# Patient Record
Sex: Male | Born: 1980 | Race: Black or African American | Hispanic: No | Marital: Married | Smoking: Current every day smoker
Health system: Southern US, Community
[De-identification: ages and names within clinical notes are randomized; demographics above are authoritative.]

---

## 2015-03-18 ENCOUNTER — Other Ambulatory Visit: Payer: Self-pay | Admitting: Infectious Disease

## 2015-03-18 ENCOUNTER — Ambulatory Visit
Admission: RE | Admit: 2015-03-18 | Discharge: 2015-03-18 | Disposition: A | Discharge status: Home / Self Care | Payer: No Typology Code available for payment source | Source: Ambulatory Visit | Attending: Infectious Disease | Admitting: Infectious Disease

## 2015-03-18 DIAGNOSIS — Z139 Encounter for screening, unspecified: Secondary | ICD-10-CM

## 2019-03-29 ENCOUNTER — Other Ambulatory Visit: Payer: Self-pay

## 2019-03-29 ENCOUNTER — Emergency Department (HOSPITAL_COMMUNITY)
Admission: EM | Admit: 2019-03-29 | Discharge: 2019-03-29 | Disposition: A | Discharge status: Home / Self Care | Payer: No Typology Code available for payment source | Attending: Emergency Medicine | Admitting: Emergency Medicine

## 2019-03-29 ENCOUNTER — Encounter (HOSPITAL_COMMUNITY): Payer: Self-pay | Admitting: Emergency Medicine

## 2019-03-29 ENCOUNTER — Emergency Department (HOSPITAL_COMMUNITY): Payer: No Typology Code available for payment source

## 2019-03-29 DIAGNOSIS — M79601 Pain in right arm: Secondary | ICD-10-CM | POA: Diagnosis not present

## 2019-03-29 DIAGNOSIS — F17210 Nicotine dependence, cigarettes, uncomplicated: Secondary | ICD-10-CM | POA: Insufficient documentation

## 2019-03-29 DIAGNOSIS — M25521 Pain in right elbow: Secondary | ICD-10-CM | POA: Diagnosis present

## 2019-03-29 DIAGNOSIS — Y9389 Activity, other specified: Secondary | ICD-10-CM | POA: Diagnosis not present

## 2019-03-29 DIAGNOSIS — Y990 Civilian activity done for income or pay: Secondary | ICD-10-CM | POA: Diagnosis not present

## 2019-03-29 DIAGNOSIS — Y9289 Other specified places as the place of occurrence of the external cause: Secondary | ICD-10-CM | POA: Insufficient documentation

## 2019-03-29 DIAGNOSIS — X500XXA Overexertion from strenuous movement or load, initial encounter: Secondary | ICD-10-CM | POA: Insufficient documentation

## 2019-03-29 MED ORDER — NAPROXEN 500 MG PO TABS: 500.0000 mg | ORAL_TABLET | Freq: Two times a day (BID) | ORAL | 0 refills | Status: DC

## 2019-03-29 NOTE — ED Provider Notes (Signed)
[facility]-EMERGENCY DEPT Provider Note   CSN: [id number] Arrival date & time: 03/29/19  [id number]     History   Chief Complaint Chief Complaint  Patient presents with  . Arm Pain    HPI Kristopher Avila is a 38 y.o. male.     Patient states that he hurt his right elbow while at work.  He was pushing a heavy object and started having pain in the right anterior elbow  The history is provided by the patient. No language interpreter was used.  Arm Pain This is a new problem. The current episode started 12 to 24 hours ago. The problem occurs constantly. The problem has not changed since onset.Pertinent negatives include no chest pain, no abdominal pain and no headaches. Nothing aggravates the symptoms. He has tried nothing for the symptoms.    History reviewed. No pertinent past medical history.  There are no active problems to display for this patient.   History reviewed. No pertinent surgical history.      Home Medications    Prior to Admission medications   Medication Sig Start Date End Date Taking? Authorizing Provider  naproxen (NAPROSYN) 500 MG tablet Take 1 tablet (500 mg total) by mouth 2 (two) times daily. 03/29/19   Bethann BerkshireZammit, Christin Mccreedy, MD    Family History No family history on file.  Social History Social History   Tobacco Use  . Smoking status: Current Every Day Smoker    Types: Cigarettes  . Smokeless tobacco: Never Used  Substance Use Topics  . Alcohol use: Not Currently  . Drug use: Not on file     Allergies   Patient has no known allergies.   Review of Systems Review of Systems  Constitutional: Negative for appetite change and fatigue.  HENT: Negative for congestion, ear discharge and sinus pressure.   Eyes: Negative for discharge.  Respiratory: Negative for cough.   Cardiovascular: Negative for chest pain.  Gastrointestinal: Negative for abdominal pain and diarrhea.  Genitourinary: Negative for frequency and hematuria.   Musculoskeletal: Negative for back pain.       Right elbow pain  Skin: Negative for rash.  Neurological: Negative for seizures and headaches.  Psychiatric/Behavioral: Negative for hallucinations.     Physical Exam Updated Vital Signs BP (!) 153/91 (BP Location: Left Arm)   Pulse 66   Temp 97.9 F (36.6 C) (Oral)   Resp 18   Ht 5\' 5"  (1.651 m)   Wt 83.9 kg   SpO2 100%   BMI 30.79 kg/m   Physical Exam Constitutional:      Appearance: He is well-developed.  HENT:     Head: Normocephalic.     Nose: Nose normal.  Eyes:     General: No scleral icterus.    Conjunctiva/sclera: Conjunctivae normal.  Neck:     Musculoskeletal: Neck supple.     Thyroid: No thyromegaly.     Trachea: No tracheal deviation.  Cardiovascular:     Rate and Rhythm: Normal rate and regular rhythm.     Heart sounds: No murmur. No friction rub. No gallop.   Pulmonary:     Breath sounds: No stridor. No wheezing or rales.  Chest:     Chest wall: No tenderness.  Abdominal:     General: There is no distension.     Tenderness: There is no abdominal tenderness. There is no rebound.  Musculoskeletal: Normal range of motion.     Comments: Tenderness to biceps tender  Lymphadenopathy:     Cervical:  No cervical adenopathy.  Skin:    General: Skin is warm.     Findings: No erythema or rash.  Neurological:     Mental Status: He is alert and oriented to person, place, and time.     Motor: No abnormal muscle tone.     Coordination: Coordination normal.  Psychiatric:        Behavior: Behavior normal.      ED Treatments / Results  Labs (all labs ordered are listed, but only abnormal results are displayed) Labs Reviewed - No data to display  EKG None  Radiology Dg Elbow Complete Right  Result Date: 03/29/2019 CLINICAL DATA:  Right elbow pain after injury at work today. EXAM: RIGHT ELBOW - COMPLETE 3+ VIEW COMPARISON:  None. FINDINGS: There is no evidence of fracture, dislocation, or joint effusion.  There is no evidence of arthropathy or other focal bone abnormality. Soft tissues are unremarkable. IMPRESSION: Negative. Electronically Signed   By: Marijo Conception M.D.   On: 03/29/2019 09:08    Procedures Procedures (including critical care time)  Medications Ordered in ED Medications - No data to display   Initial Impression / Assessment and Plan / ED Course  I have reviewed the triage vital signs and the nursing notes.  Pertinent labs & imaging results that were available during my care of the patient were reviewed by me and considered in my medical decision making (see chart for details).        Biceps tendonitis.  tx with naprosyn  Final Clinical Impressions(s) / ED Diagnoses   Final diagnoses:  Right arm pain    ED Discharge Orders         Ordered    naproxen (NAPROSYN) 500 MG tablet  2 times daily     03/29/19 0934           Milton Ferguson, MD 03/29/19 [id number]

## 2019-03-29 NOTE — ED Triage Notes (Signed)
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 Pain from shoulder to hand.

## 2019-03-29 NOTE — Discharge Instructions (Signed)
Follow up with Dr. Dean.

## 2019-08-04 ENCOUNTER — Other Ambulatory Visit: Payer: Self-pay

## 2019-08-04 ENCOUNTER — Encounter: Payer: Self-pay | Admitting: Family Medicine

## 2019-08-04 ENCOUNTER — Ambulatory Visit (INDEPENDENT_AMBULATORY_CARE_PROVIDER_SITE_OTHER): Payer: BC Managed Care – PPO | Admitting: Family Medicine

## 2019-08-04 ENCOUNTER — Other Ambulatory Visit (HOSPITAL_COMMUNITY)
Admission: RE | Admit: 2019-08-04 | Discharge: 2019-08-04 | Disposition: A | Discharge status: Home / Self Care | Payer: BC Managed Care – PPO | Source: Ambulatory Visit | Attending: Family Medicine | Admitting: Family Medicine

## 2019-08-04 VITALS — BP 140/78 | HR 84 | Temp 98.7°F | Wt 194.6 lb

## 2019-08-04 DIAGNOSIS — N341 Nonspecific urethritis: Secondary | ICD-10-CM | POA: Insufficient documentation

## 2019-08-04 DIAGNOSIS — R300 Dysuria: Secondary | ICD-10-CM

## 2019-08-04 MED ORDER — METRONIDAZOLE 500 MG PO TABS: 2000.0000 mg | ORAL_TABLET | Freq: Once | ORAL | 0 refills | Status: AC

## 2019-08-04 NOTE — Patient Instructions (Addendum)
I will check test for causes of urethritis, but think it is reasonable to take metronidazole for possible trichomonas infection.  4 pills all at once.  We will let you know when we have the results of the other test.  It is reasonable to repeat STI testing in another 4 weeks.  If symptoms are not improving in the next 1 to 2 weeks, let me know.   Return to the clinic or go to the nearest emergency room if any of your symptoms worsen or new symptoms occur.   Urethritis, Adult  Urethritis is swelling (inflammation) of the urethra. The urethra is the tube that drains urine from the bladder. It is important to get treatment for this condition early. Delayed treatment may lead to complications. What are the causes? This condition may be caused by:  Germs that are spread through sexual contact. This is the leading cause of urethritis. This may include bacterial or viral infections.  Injury to the urethra. Injury can happen after a thin, flexible tube (catheter) is inserted into the urethra to drain urine, or after medical instruments or foreign bodies are inserted into the area.  Chemical irritation. This may include contact with spermicide.  A disease that causes inflammation. This is rare. What increases the risk? The following factors may make you more likely to develop this condition:  Having sex without using a condom.  Having multiple sexual partners.  Having poor hygiene. What are the signs or symptoms? Symptoms of this condition include:  Pain with urination.  Frequent urination.  Urgent need to urinate.  Itching and pain in the vagina or penis.  Discharge coming from the penis. However, women rarely have symptoms. How is this diagnosed? This condition is diagnosed based on your medical history and symptoms as well as a physical exam. Tests may also be done. These may include:  Urine tests.  Swabs from the urethra. How is this treated? Treatment for this condition  depends on the cause. Urethritis caused by a bacterial infection is treated with antibiotic medicine. Any sexual partners must also be treated. Follow these instructions at home: Medicines  Take over-the-counter and prescription medicines only as told by your health care provider.  If you were prescribed antibiotic medicine, take it as told by your health care provider. Do not stop taking the antibiotic even if you start to feel better. Lifestyle  Avoid using perfumed soaps, bubble bath, and shampoo when you bathe or shower. Rinse the vaginal area after bathing.  Wear cotton underwear. Not wearing underwear when going to bed can help.  Make sure to wipe from front to back after using the toilet if you are male.  Do not have sex until your health care provider approves. When you do have sex, be sure to practice safe sex.  Tell anyone with whom you have had sexual relations in the past 60 days that he or she may be at risk of infection. General instructions  Drink enough fluid to keep your urine clear or pale yellow.  It is up to you to get your test results. Ask your health care provider, or the department that is doing the test, when your results will be ready.  Keep all follow-up visits as told by your health care provider. This is important.  Get tested again 3 months after treatment to make sure the infection is gone. It is important that your sexual partner also gets tested again. Contact a health care provider if:  Your symptoms have  not improved after 3 days.  Your symptoms get worse.  You have eye redness or pain.  You develop abdominal pain or pelvic pain (in females).  You develop joint pain.  You have a fever. Get help right away if:  You have severe pain in the belly, back, or side.  You vomit repeatedly. Summary  Urethritis is a swelling (inflammation) of the urethra.  This condition is caused by germs that are spread through sexual contact. This is the  main cause of this illness.  It is important to get treatment for this condition early. Delayed treatment may lead to complications.  Treatment for this condition depends on the cause. Any sexual partners must also be treated. This information is not intended to replace advice given to you by your health care provider. Make sure you discuss any questions you have with your health care provider. Document Released: 02/21/2001 Document Revised: 08/10/2017 Document Reviewed: 10/03/2016 Elsevier Patient Education  [employer].    If you have lab work done today you will be contacted with your lab results within the next 2 weeks.  If you have not heard from [place] then please contact us. The fastest way to get your results is to register for My Chart.   IF you received an x-ray today, you will receive an invoice from [facility]. Please contact [facility] at [phone] with questions or concerns regarding your invoice.   IF you received labwork today, you will receive an invoice from [facility]. Please contact [facility] at [phone] with questions or concerns regarding your invoice.   Our billing staff will not be able to assist you with questions regarding bills from these companies.  You will be contacted with the lab results as soon as they are available. The fastest way to get your results is to activate your My Chart account. Instructions are located on the last page of this paperwork. If you have not heard from [place] regarding the results in 2 weeks, please contact this office.

## 2019-08-04 NOTE — Progress Notes (Signed)
Subjective:  Patient ID: Kristopher Avila, male    DOB: Jul 17, 1981  Age: 38 y.o. MRN: [id number]  CC:  Chief Complaint  Patient presents with   Penis Pain    pain pain after unprotected sex but was seen at Urgent care on 07/17/19 and checked for std but they came back negative but still having penial pain.    HPI Kristopher Avila presents for   Penile pain: Started inside the penis on 10/26. Sometimes testicular, but primarily penis.  Unprotected intercourse day prior with new partner. 11 days later - had testing at urgent care 11/5- had reported negative/normal  HIV, syphilis, chlamydia and gonorrhea testing.  No penile rash/blister, no discharge.  Some dysuria - particularly at end of urination. No prior similar sx's. No attempted treatments.   No change in diet/caffeine/spicy food.       History There are no active problems to display for this patient.  No past medical history on file. No past surgical history on file. No Known Allergies Prior to Admission medications   Not on File   Social History   Socioeconomic History   Marital status: Married    Spouse name: Not on file   Number of children: Not on file   Years of education: Not on file   Highest education level: Not on file  Occupational History   Not on file  Social Needs   Financial resource strain: Not on file   Food insecurity    Worry: Not on file    Inability: Not on file   Transportation needs    Medical: Not on file    Non-medical: Not on file  Tobacco Use   Smoking status: Current Every Day Smoker    Packs/day: 0.50    Types: Cigarettes   Smokeless tobacco: Current User  Substance and Sexual Activity   Alcohol use: Not Currently   Drug use: Not Currently   Sexual activity: Yes  Lifestyle   Physical activity    Days per week: Not on file    Minutes per session: Not on file   Stress: Not on file  Relationships   Social connections    Talks on phone: Not on file    Gets together:  Not on file    Attends religious service: Not on file    Active member of club or organization: Not on file    Attends meetings of clubs or organizations: Not on file    Relationship status: Not on file   Intimate partner violence    Fear of current or ex partner: Not on file    Emotionally abused: Not on file    Physically abused: Not on file    Forced sexual activity: Not on file  Other Topics Concern   Not on file  Social History Narrative   Not on file    Review of Systems   Objective:   Vitals:   08/04/19 1400 08/04/19 1407  BP: 140/78 140/78  Pulse: 84   Temp: 98.7 F (37.1 C)   TempSrc: Oral   SpO2: 97%   Weight: 194 lb 9.6 oz (88.3 kg)      Physical Exam Constitutional:      General: He is not in acute distress.    Appearance: He is well-developed.  HENT:     Head: Normocephalic and atraumatic.  Cardiovascular:     Rate and Rhythm: Normal rate.  Pulmonary:     Effort: Pulmonary effort is normal.  Genitourinary:  Pubic Area: No rash.      Penis: Normal. No discharge, swelling or lesions.      Scrotum/Testes: Normal.        Right: Tenderness not present.        Left: Tenderness not present.     Epididymis:     Right: No tenderness.     Left: No tenderness.  Lymphadenopathy:     Lower Body: No right inguinal adenopathy. No left inguinal adenopathy.  Neurological:     Mental Status: He is alert and oriented to person, place, and time.        Assessment & Plan:  Kristopher Avila is a 38 y.o. male . NGU (nongonococcal urethritis) - Plan: Urine cytology ancillary only, metroNIDAZOLE (FLAGYL) 500 MG tablet  Dysuria  Suspected nongonococcal urethritis with reported previous negative HIV, RPR, chlamydia and gonorrhea testing.  With persistent symptoms will repeat testing for gonorrhea and trichomonas but treat for possible trichomonas with metronidazole 2 g x 1.  Potential side effects discussed.  RTC precautions given if persistent and to consider repeat  STI testing in 1 month.    No orders of the defined types were placed in this encounter.  Patient Instructions   I will check test for causes of urethritis, but think it is reasonable to take metronidazole for possible trichomonas infection.  4 pills all at once.  We will let you know when we have the results of the other test.  It is reasonable to repeat STI testing in another 4 weeks.  If symptoms are not improving in the next 1 to 2 weeks, let me know.   Return to the clinic or go to the nearest emergency room if any of your symptoms worsen or new symptoms occur.   Urethritis, Adult  Urethritis is swelling (inflammation) of the urethra. The urethra is the tube that drains urine from the bladder. It is important to get treatment for this condition early. Delayed treatment may lead to complications. What are the causes? This condition may be caused by:  Germs that are spread through sexual contact. This is the leading cause of urethritis. This may include bacterial or viral infections.  Injury to the urethra. Injury can happen after a thin, flexible tube (catheter) is inserted into the urethra to drain urine, or after medical instruments or foreign bodies are inserted into the area.  Chemical irritation. This may include contact with spermicide.  A disease that causes inflammation. This is rare. What increases the risk? The following factors may make you more likely to develop this condition:  Having sex without using a condom.  Having multiple sexual partners.  Having poor hygiene. What are the signs or symptoms? Symptoms of this condition include:  Pain with urination.  Frequent urination.  Urgent need to urinate.  Itching and pain in the vagina or penis.  Discharge coming from the penis. However, women rarely have symptoms. How is this diagnosed? This condition is diagnosed based on your medical history and symptoms as well as a physical exam. Tests may also be  done. These may include:  Urine tests.  Swabs from the urethra. How is this treated? Treatment for this condition depends on the cause. Urethritis caused by a bacterial infection is treated with antibiotic medicine. Any sexual partners must also be treated. Follow these instructions at home: Medicines  Take over-the-counter and prescription medicines only as told by your health care provider.  If you were prescribed antibiotic medicine, take it as told by your  health care provider. Do not stop taking the antibiotic even if you start to feel better. Lifestyle  Avoid using perfumed soaps, bubble bath, and shampoo when you bathe or shower. Rinse the vaginal area after bathing.  Wear cotton underwear. Not wearing underwear when going to bed can help.  Make sure to wipe from front to back after using the toilet if you are male.  Do not have sex until your health care provider approves. When you do have sex, be sure to practice safe sex.  Tell anyone with whom you have had sexual relations in the past 60 days that he or she may be at risk of infection. General instructions  Drink enough fluid to keep your urine clear or pale yellow.  It is up to you to get your test results. Ask your health care provider, or the department that is doing the test, when your results will be ready.  Keep all follow-up visits as told by your health care provider. This is important.  Get tested again 3 months after treatment to make sure the infection is gone. It is important that your sexual partner also gets tested again. Contact a health care provider if:  Your symptoms have not improved after 3 days.  Your symptoms get worse.  You have eye redness or pain.  You develop abdominal pain or pelvic pain (in females).  You develop joint pain.  You have a fever. Get help right away if:  You have severe pain in the belly, back, or side.  You vomit repeatedly. Summary  Urethritis is a swelling  (inflammation) of the urethra.  This condition is caused by germs that are spread through sexual contact. This is the main cause of this illness.  It is important to get treatment for this condition early. Delayed treatment may lead to complications.  Treatment for this condition depends on the cause. Any sexual partners must also be treated. This information is not intended to replace advice given to you by your health care provider. Make sure you discuss any questions you have with your health care provider. Document Released: 02/21/2001 Document Revised: 08/10/2017 Document Reviewed: 10/03/2016 Elsevier Patient Education  [employer]2020 [employer] Inc[employer]    If you have lab work done today you will be contacted with your lab results within the next 2 weeks.  If you have not heard from us then please contact us. The fastest way to get your results is to register for My Chart.   IF you received an x-ray today, you will receive an invoice from [facility]. Please contact [facility] at [phone] with questions or concerns regarding your invoice.   IF you received labwork today, you will receive an invoice from [facility]. Please contact [facility] at [phone] with questions or concerns regarding your invoice.   Our billing staff will not be able to assist you with questions regarding bills from these companies.  You will be contacted with the lab results as soon as they are available. The fastest way to get your results is to activate your My Chart account. Instructions are located on the last page of this paperwork. If you have not heard from us regarding the results in 2 weeks, please contact this office.          Signed, Meredith StaggersJeffrey Lakayla Barrington, MD [facility]

## 2019-08-05 LAB — URINE CYTOLOGY ANCILLARY ONLY
Chlamydia: POSITIVE — AB
Comment: NEGATIVE
Comment: NEGATIVE
Comment: NORMAL
Neisseria Gonorrhea: NEGATIVE
Trichomonas: NEGATIVE

## 2019-08-08 ENCOUNTER — Other Ambulatory Visit: Payer: Self-pay | Admitting: Family Medicine

## 2019-08-08 DIAGNOSIS — A749 Chlamydial infection, unspecified: Secondary | ICD-10-CM

## 2019-08-08 MED ORDER — AZITHROMYCIN 250 MG PO TABS: 1000.0000 mg | ORAL_TABLET | Freq: Once | ORAL | 0 refills | Status: AC

## 2019-08-11 ENCOUNTER — Telehealth: Payer: Self-pay | Admitting: Family Medicine

## 2019-08-11 NOTE — Telephone Encounter (Signed)
Pt needs a call back  To see when he can get retested again   Please advise

## 2019-08-11 NOTE — Telephone Encounter (Signed)
Spoke with pt and he wants to know if he needs to return for testing.  Advised he doesn't need to be retested because he was given azithromycin 1000 mg  x 1 and this will clear it up.  Pt interested in having unprotected sex with partner.  I advised to use protection for the next week.  Pt agreeable.

## 2019-08-25 ENCOUNTER — Ambulatory Visit: Payer: BC Managed Care – PPO | Admitting: Family Medicine

## 2019-08-28 ENCOUNTER — Other Ambulatory Visit: Payer: Self-pay

## 2019-08-28 ENCOUNTER — Other Ambulatory Visit (HOSPITAL_COMMUNITY)
Admission: RE | Admit: 2019-08-28 | Discharge: 2019-08-28 | Disposition: A | Discharge status: Home / Self Care | Payer: BC Managed Care – PPO | Source: Ambulatory Visit | Attending: Family Medicine | Admitting: Family Medicine

## 2019-08-28 ENCOUNTER — Ambulatory Visit (INDEPENDENT_AMBULATORY_CARE_PROVIDER_SITE_OTHER): Payer: BC Managed Care – PPO | Admitting: Family Medicine

## 2019-08-28 ENCOUNTER — Encounter: Payer: Self-pay | Admitting: Family Medicine

## 2019-08-28 VITALS — BP 133/82 | HR 72 | Temp 98.4°F | Wt 194.8 lb

## 2019-08-28 DIAGNOSIS — A749 Chlamydial infection, unspecified: Secondary | ICD-10-CM | POA: Diagnosis not present

## 2019-08-28 DIAGNOSIS — Z7251 High risk heterosexual behavior: Secondary | ICD-10-CM | POA: Diagnosis not present

## 2019-08-28 NOTE — Progress Notes (Signed)
Subjective:  Patient ID: Kristopher Avila, male    DOB: [birth year]  Age: 38 y.o. MRN: [id number]  CC:  Chief Complaint  Patient presents with  . urethritis    Here 3 f/u on urethritis. No new issues ans no other issues to discuss at this time    HPI Kristopher Avila presents for   Penile pain: Discussed November 23.  Symptoms started October 26, unprotected intercourse the day prior.  Primarily penile discomfort, occasionally testicular.  Had reported negative STI testing at outside urgent care November 5.  Still some persistent dysuria when he saw me November 23.  His prior normal testing reported, suspicious for nongonococcal urethritis, treated with metronidazole for possible trichomonas.  Unfortunately his testing did come back positive for chlamydia.  Prescription for 1000 mg azithromycin x1 given on November 27. Took prescription. Occasional discomfort for about a week later.   Feels ok now. No further dysuria, no penile pain. No rash.   No unprotected intercourse since 10/25.   History There are no problems to display for this patient.  No past medical history on file. No past surgical history on file. No Known Allergies Prior to Admission medications   Not on File   Social History   Socioeconomic History  . Marital status: Married    Spouse name: Not on file  . Number of children: Not on file  . Years of education: Not on file  . Highest education level: Not on file  Occupational History  . Not on file  Tobacco Use  . Smoking status: Current Every Day Smoker    Packs/day: 0.50    Types: Cigarettes  . Smokeless tobacco: Current User  Substance and Sexual Activity  . Alcohol use: Not Currently  . Drug use: Not Currently  . Sexual activity: Yes  Other Topics Concern  . Not on file  Social History Narrative  . Not on file   Social Determinants of Health   Financial Resource Strain:   . Difficulty of Paying Living Expenses: Not on file  Food Insecurity:   . Worried  About Charity fundraiser in the Last Year: Not on file  . Ran Out of Food in the Last Year: Not on file  Transportation Needs:   . Lack of Transportation (Medical): Not on file  . Lack of Transportation (Non-Medical): Not on file  Physical Activity:   . Days of Exercise per Week: Not on file  . Minutes of Exercise per Session: Not on file  Stress:   . Feeling of Stress : Not on file  Social Connections:   . Frequency of Communication with Friends and Family: Not on file  . Frequency of Social Gatherings with Friends and Family: Not on file  . Attends Religious Services: Not on file  . Active Member of Clubs or Organizations: Not on file  . Attends Archivist Meetings: Not on file  . Marital Status: Not on file  Intimate Partner Violence:   . Fear of Current or Ex-Partner: Not on file  . Emotionally Abused: Not on file  . Physically Abused: Not on file  . Sexually Abused: Not on file    Review of Systems  Genitourinary: Negative for difficulty urinating, discharge, dysuria, genital sores, hematuria, penile pain, scrotal swelling and testicular pain.     Objective:   Vitals:   08/28/19 1431  BP: 133/82  Pulse: 72  Temp: 98.4 F (36.9 C)  TempSrc: Temporal  SpO2: 100%  Weight: 194 lb  12.8 oz (88.4 kg)     Physical Exam Constitutional:      General: He is not in acute distress.    Appearance: He is well-developed.  HENT:     Head: Normocephalic and atraumatic.  Cardiovascular:     Rate and Rhythm: Normal rate.  Pulmonary:     Effort: Pulmonary effort is normal.  Abdominal:     General: Abdomen is flat.     Tenderness: There is no abdominal tenderness. There is no right CVA tenderness or left CVA tenderness.  Neurological:     Mental Status: He is alert and oriented to person, place, and time.        Assessment & Plan:  Kristopher Avila is a 38 y.o. male . Chlamydia - Plan: HIV antibody, RPR, Urine cytology ancillary only  Unprotected sexual  intercourse - Plan: HIV antibody, RPR, Urine cytology ancillary only Chlamydia infection, with resolution of symptoms.  Status post azithromycin as above.  Requested repeat testing, although discussed test of cure not typically necessary.  Safer sex practices discussed.  RTC precautions given.  No orders of the defined types were placed in this encounter.  Patient Instructions    Although we can recheck testing today, based on the resolution of your symptoms with medication, you should not be contagious at this point.  I do recommend condoms to minimize exposure to sexually transmitted infections.  Please let me know if there are questions and take care.    If you have lab work done today you will be contacted with your lab results within the next 2 weeks.  If you have not heard from [place] then please contact us. The fastest way to get your results is to register for My Chart.   IF you received an x-ray today, you will receive an invoice from [facility]. Please contact [facility] at [phone] with questions or concerns regarding your invoice.   IF you received labwork today, you will receive an invoice from [place]. Please contact [facility] at [phone] with questions or concerns regarding your invoice.   Our billing staff will not be able to assist you with questions regarding bills from these companies.  You will be contacted with the lab results as soon as they are available. The fastest way to get your results is to activate your My Chart account. Instructions are located on the last page of this paperwork. If you have not heard from [place] regarding the results in 2 weeks, please contact this office.          Signed, Meredith Staggers, MD [facility]

## 2019-08-28 NOTE — Patient Instructions (Addendum)
  Although we can recheck testing today, based on the resolution of your symptoms with medication, you should not be contagious at this point.  I do recommend condoms to minimize exposure to sexually transmitted infections.  Please let me know if there are questions and take care.    If you have lab work done today you will be contacted with your lab results within the next 2 weeks.  If you have not heard from [place] then please contact us. The fastest way to get your results is to register for My Chart.   IF you received an x-ray today, you will receive an invoice from [facility]. Please contact [facility] at [phone] with questions or concerns regarding your invoice.   IF you received labwork today, you will receive an invoice from [place]. Please contact [facility] at [phone] with questions or concerns regarding your invoice.   Our billing staff will not be able to assist you with questions regarding bills from these companies.  You will be contacted with the lab results as soon as they are available. The fastest way to get your results is to activate your My Chart account. Instructions are located on the last page of this paperwork. If you have not heard from [place] regarding the results in 2 weeks, please contact this office.

## 2019-08-29 LAB — URINE CYTOLOGY ANCILLARY ONLY
Chlamydia: NEGATIVE
Comment: NEGATIVE
Comment: NORMAL
Neisseria Gonorrhea: NEGATIVE

## 2019-08-29 LAB — HIV ANTIBODY (ROUTINE TESTING W REFLEX): HIV Screen 4th Generation wRfx: NONREACTIVE

## 2019-08-29 LAB — RPR: RPR Ser Ql: NONREACTIVE

## 2019-09-05 ENCOUNTER — Encounter: Payer: Self-pay | Admitting: Family Medicine

## 2020-01-01 ENCOUNTER — Encounter: Payer: Self-pay | Admitting: Family Medicine

## 2020-01-02 ENCOUNTER — Telehealth: Payer: Self-pay | Admitting: Family Medicine

## 2020-01-02 NOTE — Telephone Encounter (Signed)
Called and left a message for the pt to contact us to set this up

## 2020-01-02 NOTE — Telephone Encounter (Signed)
Please schedule for this patient labs for physical scheduled for 01/23/2020

## 2020-01-20 ENCOUNTER — Ambulatory Visit (INDEPENDENT_AMBULATORY_CARE_PROVIDER_SITE_OTHER): Payer: BC Managed Care – PPO | Admitting: Family Medicine

## 2020-01-20 ENCOUNTER — Other Ambulatory Visit: Payer: Self-pay

## 2020-01-20 DIAGNOSIS — Z0000 Encounter for general adult medical examination without abnormal findings: Secondary | ICD-10-CM

## 2020-01-20 NOTE — Progress Notes (Signed)
Lab only visit 

## 2020-01-21 ENCOUNTER — Encounter: Payer: Self-pay | Admitting: Family Medicine

## 2020-01-21 LAB — CBC WITH DIFFERENTIAL/PLATELET
Basophils Absolute: 0 10*3/uL (ref 0.0–0.2)
Basos: 0 %
EOS (ABSOLUTE): 0.2 10*3/uL (ref 0.0–0.4)
Eos: 2 %
Hematocrit: 49.8 % (ref 37.5–51.0)
Hemoglobin: 16.4 g/dL (ref 13.0–17.7)
Immature Grans (Abs): 0 10*3/uL (ref 0.0–0.1)
Immature Granulocytes: 0 %
Lymphocytes Absolute: 3 10*3/uL (ref 0.7–3.1)
Lymphs: 39 %
MCH: 30.1 pg (ref 26.6–33.0)
MCHC: 32.9 g/dL (ref 31.5–35.7)
MCV: 91 fL (ref 79–97)
Monocytes Absolute: 0.7 10*3/uL (ref 0.1–0.9)
Monocytes: 9 %
Neutrophils Absolute: 3.9 10*3/uL (ref 1.4–7.0)
Neutrophils: 50 %
Platelets: 306 10*3/uL (ref 150–450)
RBC: 5.45 x10E6/uL (ref 4.14–5.80)
RDW: 12.8 % (ref 11.6–15.4)
WBC: 7.8 10*3/uL (ref 3.4–10.8)

## 2020-01-21 LAB — CMP14+EGFR
ALT: 39 IU/L (ref 0–44)
AST: 25 IU/L (ref 0–40)
Albumin/Globulin Ratio: 1.8 (ref 1.2–2.2)
Albumin: 4.8 g/dL (ref 4.0–5.0)
Alkaline Phosphatase: 72 IU/L (ref 39–117)
BUN/Creatinine Ratio: 15 (ref 9–20)
BUN: 11 mg/dL (ref 6–24)
Bilirubin Total: 0.8 mg/dL (ref 0.0–1.2)
CO2: 23 mmol/L (ref 20–29)
Calcium: 9.8 mg/dL (ref 8.7–10.2)
Chloride: 101 mmol/L (ref 96–106)
Creatinine, Ser: 0.75 mg/dL — ABNORMAL LOW (ref 0.76–1.27)
GFR calc Af Amer: 133 mL/min/{1.73_m2} (ref >59)
GFR calc non Af Amer: 115 mL/min/{1.73_m2} (ref >59)
Globulin, Total: 2.6 g/dL (ref 1.5–4.5)
Glucose: 100 mg/dL — ABNORMAL HIGH (ref 65–99)
Potassium: 4.5 mmol/L (ref 3.5–5.2)
Sodium: 135 mmol/L (ref 134–144)
Total Protein: 7.4 g/dL (ref 6.0–8.5)

## 2020-01-21 LAB — LIPID PANEL
Chol/HDL Ratio: 5.5 ratio — ABNORMAL HIGH (ref 0.0–5.0)
Cholesterol, Total: 242 mg/dL — ABNORMAL HIGH (ref 100–199)
HDL: 44 mg/dL (ref >39)
LDL Chol Calc (NIH): 183 mg/dL — ABNORMAL HIGH (ref 0–99)
Triglycerides: 88 mg/dL (ref 0–149)
VLDL Cholesterol Cal: 15 mg/dL (ref 5–40)

## 2020-01-21 LAB — TSH: TSH: 1.9 u[IU]/mL (ref 0.450–4.500)

## 2020-01-23 ENCOUNTER — Other Ambulatory Visit: Payer: Self-pay

## 2020-01-23 ENCOUNTER — Encounter: Payer: Self-pay | Admitting: Family Medicine

## 2020-01-23 ENCOUNTER — Ambulatory Visit (INDEPENDENT_AMBULATORY_CARE_PROVIDER_SITE_OTHER): Payer: BC Managed Care – PPO | Admitting: Family Medicine

## 2020-01-23 VITALS — BP 137/83 | HR 96 | Temp 98.6°F | Resp 15 | Ht 66.0 in | Wt 190.2 lb

## 2020-01-23 DIAGNOSIS — F17210 Nicotine dependence, cigarettes, uncomplicated: Secondary | ICD-10-CM

## 2020-01-23 DIAGNOSIS — E7800 Pure hypercholesterolemia, unspecified: Secondary | ICD-10-CM

## 2020-01-23 DIAGNOSIS — Z0000 Encounter for general adult medical examination without abnormal findings: Secondary | ICD-10-CM

## 2020-01-23 DIAGNOSIS — R739 Hyperglycemia, unspecified: Secondary | ICD-10-CM

## 2020-01-23 DIAGNOSIS — Z0001 Encounter for general adult medical examination with abnormal findings: Secondary | ICD-10-CM | POA: Diagnosis not present

## 2020-01-23 NOTE — Patient Instructions (Addendum)
Exercise  - 156mins per week. Recheck labs and blood sugar in 6 months. Can decide on cholesterol meds in 6 months after repeat testing.  Let me know when you are ready to quit smoking.   Steps to Quit Smoking Smoking tobacco is the leading cause of preventable death. It can affect almost every organ in the body. Smoking puts you and those around you at risk for developing many serious chronic diseases. Quitting smoking can be difficult, but it is one of the best things that you can do for your health. It is never too late to quit. How do I get ready to quit? When you decide to quit smoking, create a plan to help you succeed. Before you quit:  Pick a date to quit. Set a date within the next 2 weeks to give you time to prepare.  Write down the reasons why you are quitting. Keep this list in places where you will see it often.  Tell your family, friends, and co-workers that you are quitting. Support from your loved ones can make quitting easier.  Talk with your health care provider about your options for quitting smoking.  Find out what treatment options are covered by your health insurance.  Identify people, places, things, and activities that make you want to smoke (triggers). Avoid them. What first steps can I take to quit smoking?  Throw away all cigarettes at home, at work, and in your car.  Throw away smoking accessories, such as Scientist, research (medical).  Clean your car. Make sure to empty the ashtray.  Clean your home, including curtains and carpets. What strategies can I use to quit smoking? Talk with your health care provider about combining strategies, such as taking medicines while you are also receiving in-person counseling. Using these two strategies together makes you more likely to succeed in quitting than if you used either strategy on its own.  If you are pregnant or breastfeeding, talk with your health care provider about finding counseling or other support strategies to  quit smoking. Do not take medicine to help you quit smoking unless your health care provider tells you to do so. To quit smoking: Quit right away  Quit smoking completely, instead of gradually reducing how much you smoke over a period of time. Research shows that stopping smoking right away is more successful than gradually quitting.  Attend in-person counseling to help you build problem-solving skills. You are more likely to succeed in quitting if you attend counseling sessions regularly. Even short sessions of 10 minutes can be effective. Take medicine You may take medicines to help you quit smoking. Some medicines require a prescription and some you can purchase over-the-counter. Medicines may have nicotine in them to replace the nicotine in cigarettes. Medicines may:  Help to stop cravings.  Help to relieve withdrawal symptoms. Your health care provider may recommend:  Nicotine patches, gum, or lozenges.  Nicotine inhalers or sprays.  Non-nicotine medicine that is taken by mouth. Find resources Find resources and support systems that can help you to quit smoking and remain smoke-free after you quit. These resources are most helpful when you use them often. They include:  Online chats with a Social worker.  Telephone quitlines.  Printed Furniture conservator/restorer.  Support groups or group counseling.  Text messaging programs.  Mobile phone apps or applications. Use apps that can help you stick to your quit plan by providing reminders, tips, and encouragement. There are many free apps for mobile devices as well as websites.  Examples include Quit Guide from the [employer] and smokefree.gov What things can I do to make it easier to quit?   Reach out to your family and friends for support and encouragement. Call telephone quitlines (1-800-QUIT-NOW), reach out to support groups, or work with a counselor for support.  Ask people who smoke to avoid smoking around you.  Avoid places that trigger  you to smoke, such as bars, parties, or smoke-break areas at work.  Spend time with people who do not smoke.  Lessen the stress in your life. Stress can be a smoking trigger for some people. To lessen stress, try: ? Exercising regularly. ? Doing deep-breathing exercises. ? Doing yoga. ? Meditating. ? Performing a body scan. This involves closing your eyes, scanning your body from head to toe, and noticing which parts of your body are particularly tense. Try to relax the muscles in those areas. How will I feel when I quit smoking? Day 1 to 3 weeks Within the first 24 hours of quitting smoking, you may start to feel withdrawal symptoms. These symptoms are usually most noticeable 2-3 days after quitting, but they usually do not last for more than 2-3 weeks. You may experience these symptoms:  Mood swings.  Restlessness, anxiety, or irritability.  Trouble concentrating.  Dizziness.  Strong cravings for sugary foods and nicotine.  Mild weight gain.  Constipation.  Nausea.  Coughing or a sore throat.  Changes in how the medicines that you take for unrelated issues work in your body.  Depression.  Trouble sleeping (insomnia). Week 3 and afterward After the first 2-3 weeks of quitting, you may start to notice more positive results, such as:  Improved sense of smell and taste.  Decreased coughing and sore throat.  Slower heart rate.  Lower blood pressure.  Clearer skin.  The ability to breathe more easily.  Fewer sick days. Quitting smoking can be very challenging. Do not get discouraged if you are not successful the first time. Some people need to make many attempts to quit before they achieve long-term success. Do your best to stick to your quit plan, and talk with your health care provider if you have any questions or concerns. Summary  Smoking tobacco is the leading cause of preventable death. Quitting smoking is one of the best things that you can do for your  health.  When you decide to quit smoking, create a plan to help you succeed.  Quit smoking right away, not slowly over a period of time.  When you start quitting, seek help from your health care provider, family, or friends. This information is not intended to replace advice given to you by your health care provider. Make sure you discuss any questions you have with your health care provider. Document Revised: 05/23/2019 Document Reviewed: 11/16/2018 Elsevier Patient Education  [employer].   If you have lab work done today you will be contacted with your lab results within the next 2 weeks.  If you have not heard from [place] then please contact us. The fastest way to get your results is to register for My Chart.   IF you received an x-ray today, you will receive an invoice from [facility]. Please contact [facility] at [phone] with questions or concerns regarding your invoice.   IF you received labwork today, you will receive an invoice from [place]. Please contact [facility] at [phone] with questions or concerns regarding your invoice.   Our billing staff will not be able to assist you with  questions regarding bills from these companies.  You will be contacted with the lab results as soon as they are available. The fastest way to get your results is to activate your My Chart account. Instructions are located on the last page of this paperwork. If you have not heard from [place] regarding the results in 2 weeks, please contact this office.

## 2020-01-23 NOTE — Progress Notes (Signed)
Subjective:  Patient ID: Kristopher Avila, male    DOB: 15-Oct-1980  Age: 39 y.o. MRN: [id number]  CC:  Chief Complaint  Patient presents with  . Annual Exam    pt has some concerns about cholesterol and other lab work, otherwise doing well    HPI Kristopher Avila presents for   Annual physical exam.  Treated for chlamydia in November 2020.  Repeat testing in December 17 was negative/normal.  Nonreactive HIV, RPR in December. No new sexual contacts. Declines testing today.   Tobacco use:  Smoking 1/2 ppd, past 54yrs.  1 quit attempt 6 yrs ago. Gum.  Was not able stay smoke free.  Interested in quitting - would like to think about some more.   Hyperlipidemia: Elevated on recent screening testing for today's visit.  No current medications. Reports persistent elevation for some time, not changed with diet/exercise in past.  No FH of early cardiac disease/cvd.  The 10-year ASCVD risk score Denman George DC Montez Hageman., et al., 2013) is: 7.3%   Values used to calculate the score:     Age: 27 years     Sex: Male     Is Non-Hispanic African American: No     Diabetic: No     Tobacco smoker: Yes     Systolic Blood Pressure: 137 mmHg     Is BP treated: No     HDL Cholesterol: 44 mg/dL     Total Cholesterol: 242 mg/dL  Lab Results  Component Value Date   CHOL 242 (H) 01/20/2020   HDL 44 01/20/2020   LDLCALC 183 (H) 01/20/2020   TRIG 88 01/20/2020   CHOLHDL 5.5 (H) 01/20/2020   Lab Results  Component Value Date   ALT 39 01/20/2020   AST 25 01/20/2020   ALKPHOS 72 01/20/2020   BILITOT 0.8 01/20/2020   Hyperglycemia/obesity. Borderline with glucose 100 on fasting labs May 11.   Overall weight has improved 4 pounds since December of last year. No soda/sweet tea, no fast food, trying to eat healthy.  Body mass index is 30.7 kg/m. Wt Readings from Last 3 Encounters:  01/23/20 190 lb 3.2 oz (86.3 kg)  08/28/19 194 lb 12.8 oz (88.4 kg)  08/04/19 194 lb 9.6 oz (88.3 kg)    There is no immunization  history on file for this patient. Covid vaccine: Tetanus:  Depression screen [facility] 2/9 01/23/2020 08/28/2019 08/04/2019  Decreased Interest 0 0 0  Down, Depressed, Hopeless 0 0 0  PHQ - 2 Score 0 0 0    Hearing Screening   125Hz  250Hz  500Hz  1000Hz  2000Hz  3000Hz  4000Hz  6000Hz  8000Hz   Right ear:           Left ear:             Visual Acuity Screening   Right eye Left eye Both eyes  Without correction: 20/25 20/20-2 20/20  With correction:     no contacts/glasses.   Dental:no regular care  Exercise: physical work. No exercise outside of work.      History There are no problems to display for this patient.  History reviewed. No pertinent past medical history. History reviewed. No pertinent surgical history. No Known Allergies Prior to Admission medications   Not on File   Social History   Socioeconomic History  . Marital status: Married    Spouse name: Not on file  . Number of children: Not on file  . Years of education: Not on file  . Highest education level: Not on file  Occupational History  . Not on file  Tobacco Use  . Smoking status: Current Every Day Smoker    Packs/day: 0.50    Types: Cigarettes  . Smokeless tobacco: Current User  Substance and Sexual Activity  . Alcohol use: Not Currently  . Drug use: Not Currently  . Sexual activity: Yes  Other Topics Concern  . Not on file  Social History Narrative  . Not on file   Social Determinants of Health   Financial Resource Strain:   . Difficulty of Paying Living Expenses:   Food Insecurity:   . Worried About Charity fundraiser in the Last Year:   . Arboriculturist in the Last Year:   Transportation Needs:   . Film/video editor (Medical):   Marland Kitchen Lack of Transportation (Non-Medical):   Physical Activity:   . Days of Exercise per Week:   . Minutes of Exercise per Session:   Stress:   . Feeling of Stress :   Social Connections:   . Frequency of Communication with Friends and Family:   . Frequency  of Social Gatherings with Friends and Family:   . Attends Religious Services:   . Active Member of Clubs or Organizations:   . Attends Archivist Meetings:   Marland Kitchen Marital Status:   Intimate Partner Violence:   . Fear of Current or Ex-Partner:   . Emotionally Abused:   Marland Kitchen Physically Abused:   . Sexually Abused:     Review of Systems 13 point review of systems per patient health survey noted.  Negative other than as indicated above or in HPI.    Objective:   Vitals:   01/23/20 1404  BP: 137/83  Pulse: 96  Resp: 15  Temp: 98.6 F (37 C)  TempSrc: Temporal  SpO2: 100%  Weight: 190 lb 3.2 oz (86.3 kg)  Height: 5\' 6"  (1.676 m)     Physical Exam Vitals reviewed.  Constitutional:      Appearance: He is well-developed.  HENT:     Head: Normocephalic and atraumatic.     Right Ear: External ear normal.     Left Ear: External ear normal.  Eyes:     Conjunctiva/sclera: Conjunctivae normal.     Pupils: Pupils are equal, round, and reactive to light.  Neck:     Thyroid: No thyromegaly.  Cardiovascular:     Rate and Rhythm: Normal rate and regular rhythm.     Heart sounds: Normal heart sounds.  Pulmonary:     Effort: Pulmonary effort is normal. No respiratory distress.     Breath sounds: Normal breath sounds. No wheezing.  Abdominal:     General: There is no distension.     Palpations: Abdomen is soft.     Tenderness: There is no abdominal tenderness.  Musculoskeletal:        General: No tenderness. Normal range of motion.     Cervical back: Normal range of motion and neck supple.  Lymphadenopathy:     Cervical: No cervical adenopathy.  Skin:    General: Skin is warm and dry.  Neurological:     Mental Status: He is alert and oriented to person, place, and time.     Deep Tendon Reflexes: Reflexes are normal and symmetric.  Psychiatric:        Behavior: Behavior normal.     Assessment & Plan:  Kristopher Avila is a 39 y.o. male  Annual physical exam  -  -anticipatory guidance as below in AVS, screening  labs above. Health maintenance items as above in HPI discussed/recommended as applicable.   Hyperglycemia  - borderline. Commended on diet, increase exercise. Recheck with A1c in 6 months.   Pure hypercholesterolemia  -Suspect familial component.  Option of low-dose statin discussed along with side effects and risks.  10-year ASCVD risk score borderline but not at level to definitively recommend statin at this time.  Discussed smoking cessation as initial approach as that likely will significantly decrease his 10-year ASCVD risk score.  Recheck in 6 months to determine statin need at that time.   Cigarette nicotine dependence without complication  - contemplative stage. Handout given.   No orders of the defined types were placed in this encounter.  Patient Instructions   Exercise  - 150mins per week. Recheck labs and blood sugar in 6 months. Can decide on cholesterol meds in 6 months after repeat testing.  Let me know when you are ready to quit smoking.   Steps to Quit Smoking Smoking tobacco is the leading cause of preventable death. It can affect almost every organ in the body. Smoking puts you and those around you at risk for developing many serious chronic diseases. Quitting smoking can be difficult, but it is one of the best things that you can do for your health. It is never too late to quit. How do I get ready to quit? When you decide to quit smoking, create a plan to help you succeed. Before you quit:  Pick a date to quit. Set a date within the next 2 weeks to give you time to prepare.  Write down the reasons why you are quitting. Keep this list in places where you will see it often.  Tell your family, friends, and co-workers that you are quitting. Support from your loved ones can make quitting easier.  Talk with your health care provider about your options for quitting smoking.  Find out what treatment options are covered by  your health insurance.  Identify people, places, things, and activities that make you want to smoke (triggers). Avoid them. What first steps can I take to quit smoking?  Throw away all cigarettes at home, at work, and in your car.  Throw away smoking accessories, such as Set designerashtrays and lighters.  Clean your car. Make sure to empty the ashtray.  Clean your home, including curtains and carpets. What strategies can I use to quit smoking? Talk with your health care provider about combining strategies, such as taking medicines while you are also receiving in-person counseling. Using these two strategies together makes you more likely to succeed in quitting than if you used either strategy on its own.  If you are pregnant or breastfeeding, talk with your health care provider about finding counseling or other support strategies to quit smoking. Do not take medicine to help you quit smoking unless your health care provider tells you to do so. To quit smoking: Quit right away  Quit smoking completely, instead of gradually reducing how much you smoke over a period of time. Research shows that stopping smoking right away is more successful than gradually quitting.  Attend in-person counseling to help you build problem-solving skills. You are more likely to succeed in quitting if you attend counseling sessions regularly. Even short sessions of 10 minutes can be effective. Take medicine You may take medicines to help you quit smoking. Some medicines require a prescription and some you can purchase over-the-counter. Medicines may have nicotine in them to replace the nicotine in cigarettes. Medicines  may:  Help to stop cravings.  Help to relieve withdrawal symptoms. Your health care provider may recommend:  Nicotine patches, gum, or lozenges.  Nicotine inhalers or sprays.  Non-nicotine medicine that is taken by mouth. Find resources Find resources and support systems that can help you to quit  smoking and remain smoke-free after you quit. These resources are most helpful when you use them often. They include:  Online chats with a Veterinary surgeon.  Telephone quitlines.  Printed Materials engineer.  Support groups or group counseling.  Text messaging programs.  Mobile phone apps or applications. Use apps that can help you stick to your quit plan by providing reminders, tips, and encouragement. There are many free apps for mobile devices as well as websites. Examples include Quit Guide from the [employer] and smokefree.gov What things can I do to make it easier to quit?   Reach out to your family and friends for support and encouragement. Call telephone quitlines (1-800-QUIT-NOW), reach out to support groups, or work with a counselor for support.  Ask people who smoke to avoid smoking around you.  Avoid places that trigger you to smoke, such as bars, parties, or smoke-break areas at work.  Spend time with people who do not smoke.  Lessen the stress in your life. Stress can be a smoking trigger for some people. To lessen stress, try: ? Exercising regularly. ? Doing deep-breathing exercises. ? Doing yoga. ? Meditating. ? Performing a body scan. This involves closing your eyes, scanning your body from head to toe, and noticing which parts of your body are particularly tense. Try to relax the muscles in those areas. How will I feel when I quit smoking? Day 1 to 3 weeks Within the first 24 hours of quitting smoking, you may start to feel withdrawal symptoms. These symptoms are usually most noticeable 2-3 days after quitting, but they usually do not last for more than 2-3 weeks. You may experience these symptoms:  Mood swings.  Restlessness, anxiety, or irritability.  Trouble concentrating.  Dizziness.  Strong cravings for sugary foods and nicotine.  Mild weight gain.  Constipation.  Nausea.  Coughing or a sore throat.  Changes in how the medicines that you take for  unrelated issues work in your body.  Depression.  Trouble sleeping (insomnia). Week 3 and afterward After the first 2-3 weeks of quitting, you may start to notice more positive results, such as:  Improved sense of smell and taste.  Decreased coughing and sore throat.  Slower heart rate.  Lower blood pressure.  Clearer skin.  The ability to breathe more easily.  Fewer sick days. Quitting smoking can be very challenging. Do not get discouraged if you are not successful the first time. Some people need to make many attempts to quit before they achieve long-term success. Do your best to stick to your quit plan, and talk with your health care provider if you have any questions or concerns. Summary  Smoking tobacco is the leading cause of preventable death. Quitting smoking is one of the best things that you can do for your health.  When you decide to quit smoking, create a plan to help you succeed.  Quit smoking right away, not slowly over a period of time.  When you start quitting, seek help from your health care provider, family, or friends. This information is not intended to replace advice given to you by your health care provider. Make sure you discuss any questions you have with your health care provider. Document  Revised: 05/23/2019 Document Reviewed: 11/16/2018 Elsevier Patient Education  [employer].   If you have lab work done today you will be contacted with your lab results within the next 2 weeks.  If you have not heard from [place] then please contact us. The fastest way to get your results is to register for My Chart.   IF you received an x-ray today, you will receive an invoice from [facility]. Please contact [facility] at [phone] with questions or concerns regarding your invoice.   IF you received labwork today, you will receive an invoice from [place]. Please contact [facility] at [phone] with questions or concerns regarding your  invoice.   Our billing staff will not be able to assist you with questions regarding bills from these companies.  You will be contacted with the lab results as soon as they are available. The fastest way to get your results is to activate your My Chart account. Instructions are located on the last page of this paperwork. If you have not heard from [place] regarding the results in 2 weeks, please contact this office.         Signed, Meredith Staggers, MD [facility]

## 2020-07-16 ENCOUNTER — Ambulatory Visit: Payer: BC Managed Care – PPO | Admitting: Family Medicine

## 2021-01-11 ENCOUNTER — Telehealth: Payer: Self-pay

## 2021-01-11 DIAGNOSIS — E7800 Pure hypercholesterolemia, unspecified: Secondary | ICD-10-CM

## 2021-01-11 DIAGNOSIS — Z1329 Encounter for screening for other suspected endocrine disorder: Secondary | ICD-10-CM

## 2021-01-11 DIAGNOSIS — Z130 Encounter for screening for diseases of the blood and blood-forming organs and certain disorders involving the immune mechanism: Secondary | ICD-10-CM

## 2021-01-11 DIAGNOSIS — Z0000 Encounter for general adult medical examination without abnormal findings: Secondary | ICD-10-CM

## 2021-01-11 DIAGNOSIS — R739 Hyperglycemia, unspecified: Secondary | ICD-10-CM

## 2021-01-11 NOTE — Telephone Encounter (Signed)
Patient is scheduled for CPE on 7/27.  Patient is requesting labs to be entered to have done prior to appt.   If labs can be entered.  We will need to follow up with patient to schedule appt for lab.

## 2021-01-12 NOTE — Telephone Encounter (Signed)
Left pt vm to call back to schedule lab visit 3-5 days prior to CPE.

## 2021-02-21 IMAGING — CR RIGHT ELBOW - COMPLETE 3+ VIEW
4 series · 4 of 4 positions shown · non-contrast
Comparison: None.

CLINICAL DATA: Right elbow pain after injury at work today.

EXAM:
RIGHT ELBOW - COMPLETE 3+ VIEW

[x elbow ap right]
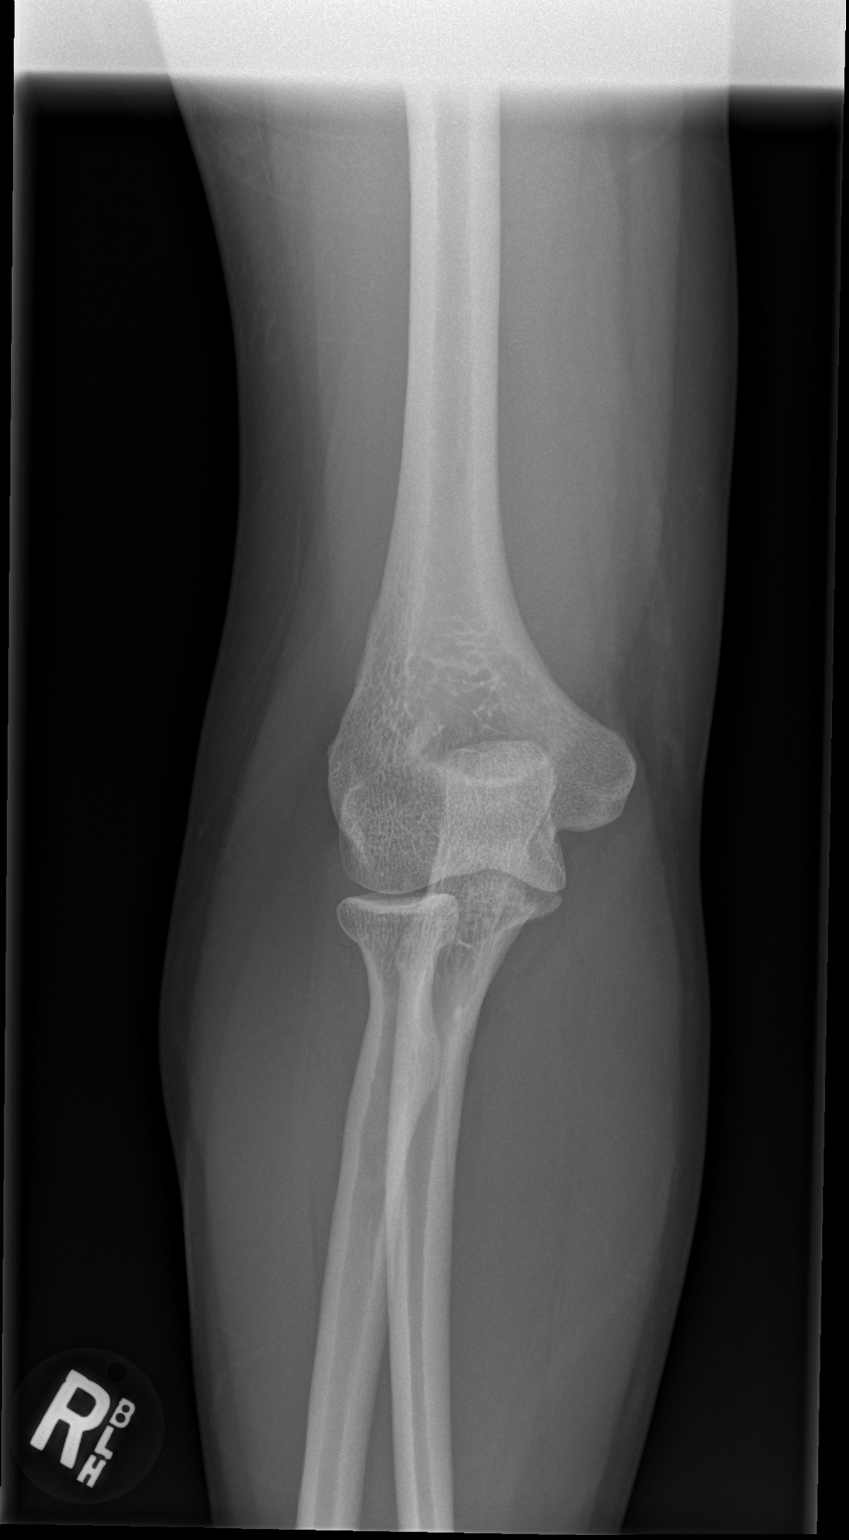

[x elbow obl right (1 of 2)]
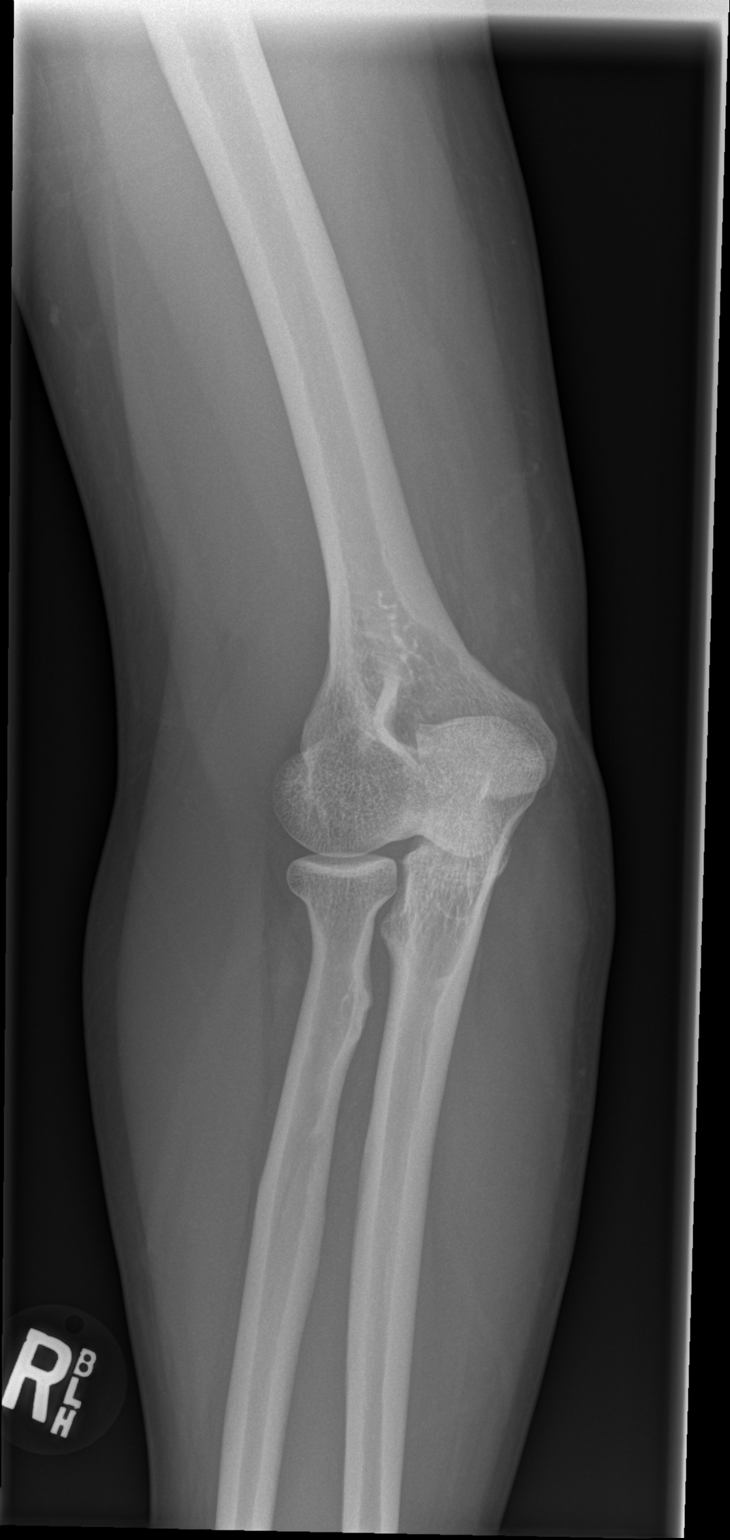

[x elbow obl right (2 of 2)]
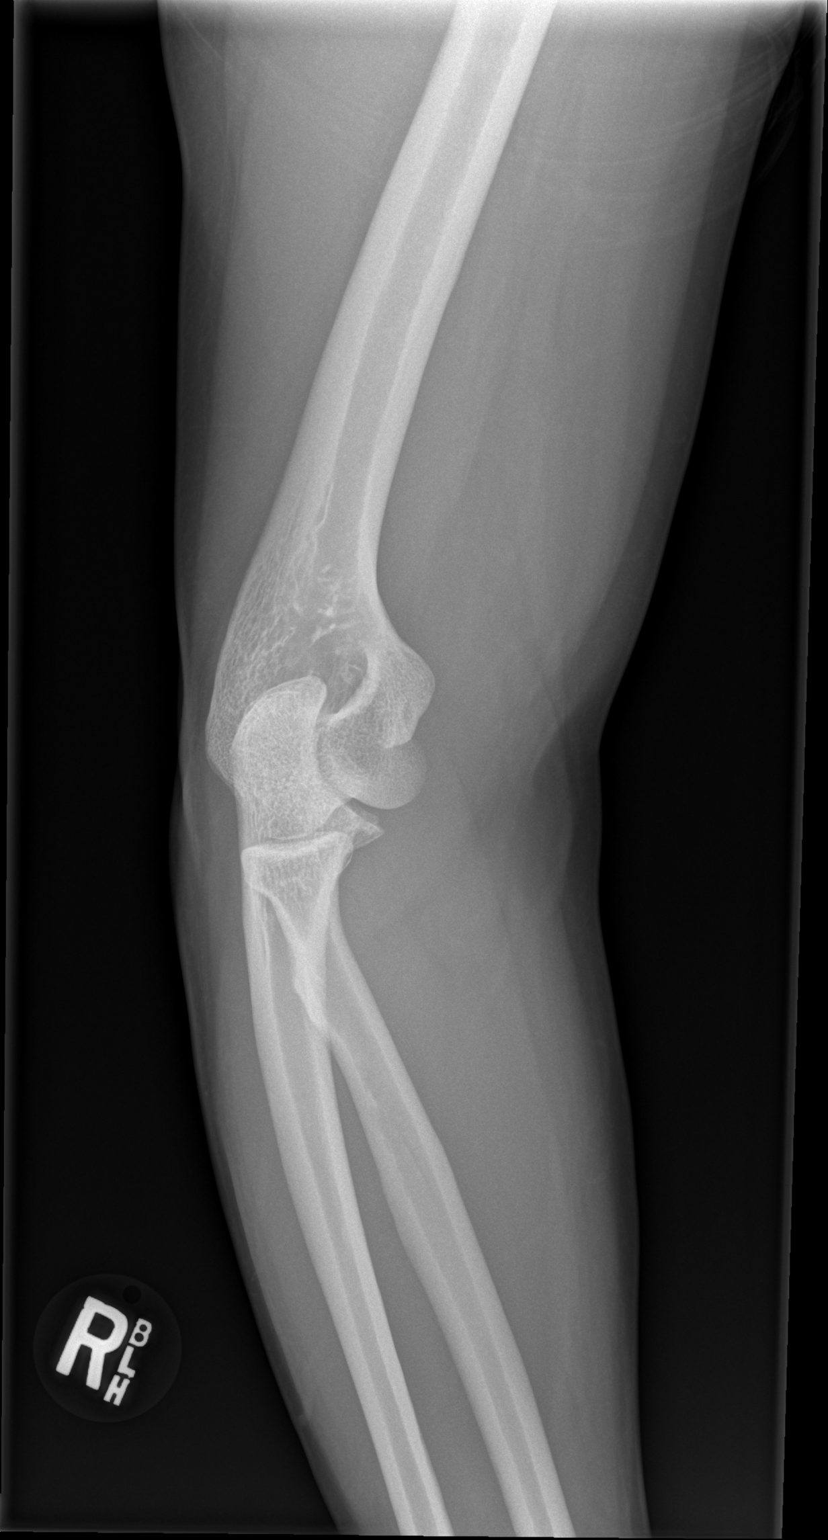

[x elbow lat right]
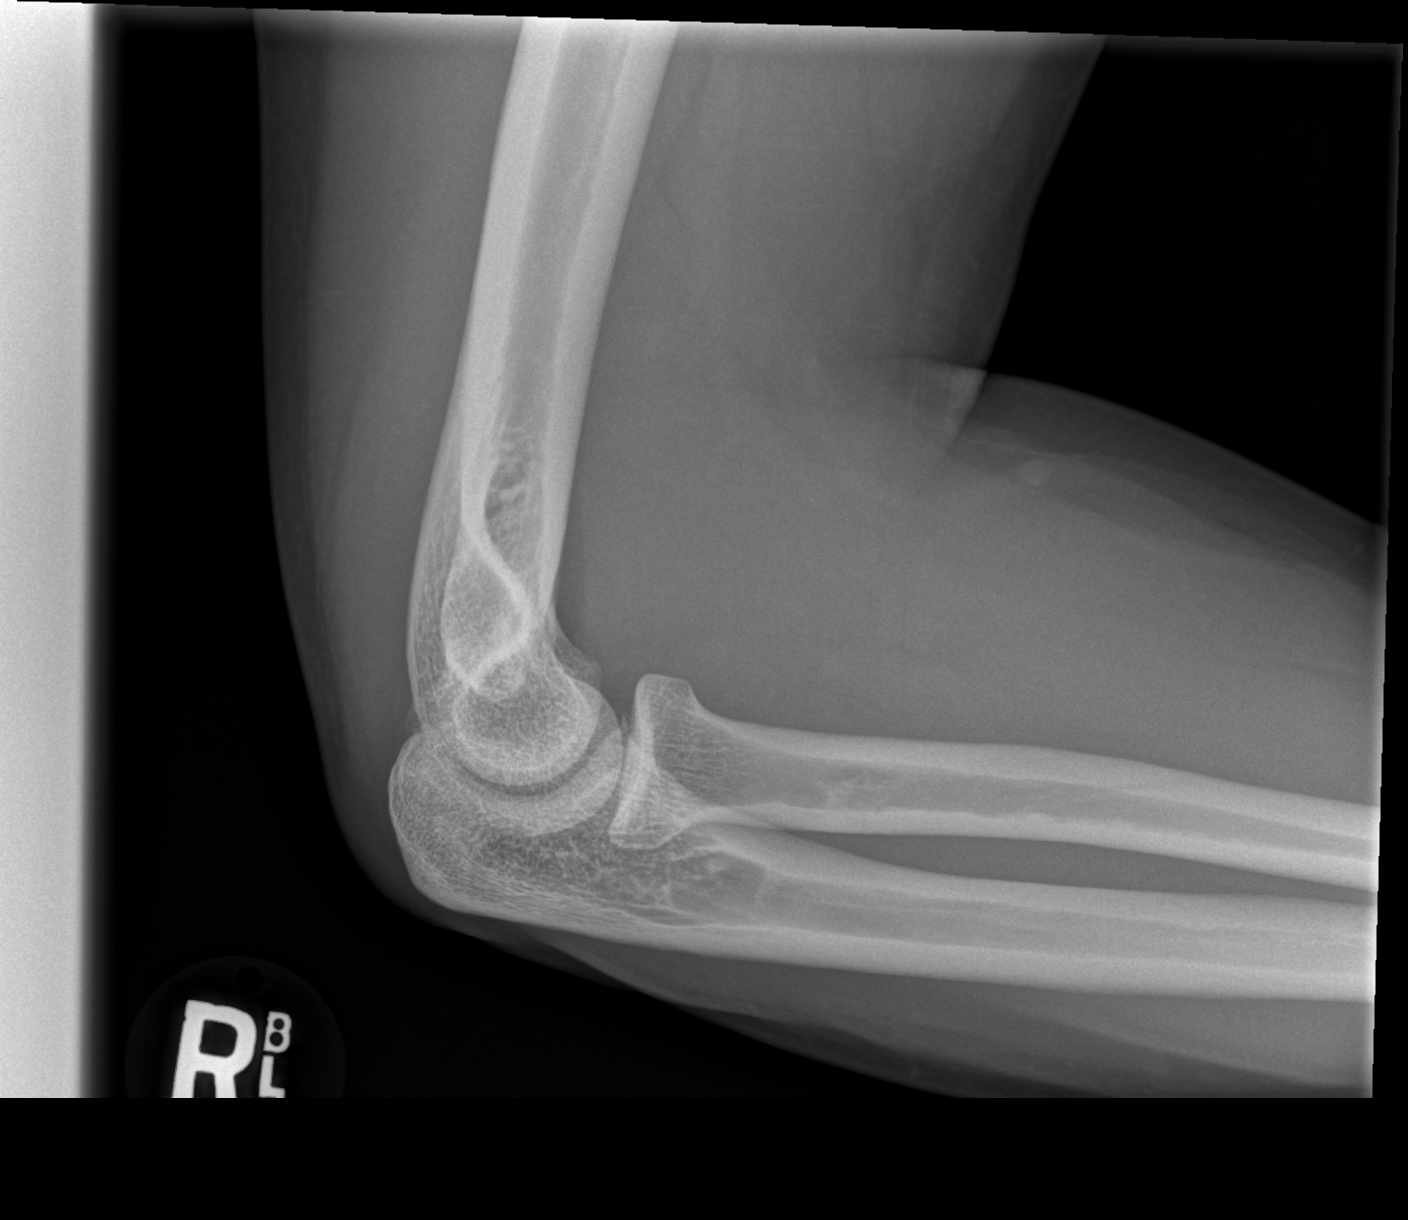

[4 of 4 positions shown; findings below may reference images not displayed]

FINDINGS: There is no evidence of fracture, dislocation, or joint effusion.
There is no evidence of arthropathy or other focal bone abnormality.
Soft tissues are unremarkable.
IMPRESSION: Negative.

## 2021-04-06 ENCOUNTER — Encounter: Payer: BC Managed Care – PPO | Admitting: Family Medicine

## 2021-07-10 ENCOUNTER — Encounter (HOSPITAL_COMMUNITY): Payer: Self-pay

## 2021-07-10 ENCOUNTER — Other Ambulatory Visit: Payer: Self-pay

## 2021-07-10 ENCOUNTER — Emergency Department (HOSPITAL_COMMUNITY)
Admission: EM | Admit: 2021-07-10 | Discharge: 2021-07-10 | Disposition: A | Discharge status: Home / Self Care | Payer: BC Managed Care – PPO | Attending: Emergency Medicine | Admitting: Emergency Medicine

## 2021-07-10 DIAGNOSIS — M25512 Pain in left shoulder: Secondary | ICD-10-CM | POA: Diagnosis present

## 2021-07-10 DIAGNOSIS — M2550 Pain in unspecified joint: Secondary | ICD-10-CM

## 2021-07-10 DIAGNOSIS — F17210 Nicotine dependence, cigarettes, uncomplicated: Secondary | ICD-10-CM | POA: Diagnosis not present

## 2021-07-10 DIAGNOSIS — X501XXA Overexertion from prolonged static or awkward postures, initial encounter: Secondary | ICD-10-CM | POA: Insufficient documentation

## 2021-07-10 DIAGNOSIS — M545 Low back pain, unspecified: Secondary | ICD-10-CM | POA: Insufficient documentation

## 2021-07-10 DIAGNOSIS — M25561 Pain in right knee: Secondary | ICD-10-CM | POA: Diagnosis not present

## 2021-07-10 MED ORDER — NAPROXEN 500 MG PO TABS: 500.0000 mg | ORAL_TABLET | Freq: Two times a day (BID) | ORAL | 0 refills | Status: AC

## 2021-07-10 NOTE — ED Triage Notes (Addendum)
Pt states he has been moving things at work that have caused pain on right side; work that is usually done by multiple people just being done by him for 1 week. Includes arm, back, and leg pain.

## 2021-07-10 NOTE — ED Provider Notes (Signed)
[facility]ENC[facility]PT Provider Note   CSN: [id number] Arrival date & time: 07/10/21  1454     History Chief Complaint  Patient presents with   Arm Pain    Kristopher Avila is a 40 y.o. male.  HPI  40 year old male presents to the emergency department today complaining of joint pain.  He states that at work he was recently moved to a new job but because someone went on vacation.  This job requires him to lift, push and pull heavy objects for upwards of 11 hours a day.  Since starting this job he has been having pain to the bilateral shoulders right greater than left, lower back pain and right knee pain.  Pain is exacerbated with heavy lifting.  His knee pain is also exacerbated with walking up stairs.  He has taken ibuprofen with minimal relief at home.  He denies any falls or trauma.  Denies any numbness or weakness of the lower extremities.  No loss control of bowel or bladder function, and no history of cancer or IV drug use.  No chest pain or shortness of breath.  History reviewed. No pertinent past medical history.  There are no problems to display for this patient.   History reviewed. No pertinent surgical history.     Family History  Problem Relation Age of Onset   Hypertension Mother    Diabetes Father     Social History   Tobacco Use   Smoking status: Every Day    Packs/day: 0.50    Types: Cigarettes   Smokeless tobacco: Current  Vaping Use   Vaping Use: Never used  Substance Use Topics   Alcohol use: Not Currently   Drug use: Not Currently    Home Medications Prior to Admission medications   Medication Sig Start Date End Date Taking? Authorizing Provider  naproxen (NAPROSYN) 500 MG tablet Take 1 tablet (500 mg total) by mouth 2 (two) times daily for 7 days. 07/10/21 07/17/21 Yes Jeslynn Hollander S, PA-C    Allergies    Patient has no known allergies.  Review of Systems   Review of Systems  Physical Exam Updated Vital Signs BP (!)  152/98 (BP Location: Left Arm)   Pulse 87   Temp 98 F (36.7 C) (Oral)   Resp 18   Ht 5\' 6"  (1.676 m)   Wt 86.2 kg   SpO2 99%   BMI 30.67 kg/m   Physical Exam Vitals and nursing note reviewed.  Constitutional:      Appearance: He is well-developed.  HENT:     Head: Normocephalic and atraumatic.  Eyes:     Conjunctiva/sclera: Conjunctivae normal.  Cardiovascular:     Rate and Rhythm: Normal rate.  Pulmonary:     Effort: Pulmonary effort is normal.  Abdominal:     Palpations: Abdomen is soft.  Musculoskeletal:     Cervical back: Neck supple.     Comments: No focal midline ttp to the thoracic or lumbar spine. Ttp to the bilat lower paraspinous muscles. TTP to the left trapezius and to the left superior shoulder. Mild ttp to the right patella. Pt has from of all extremities with normal sensation throughout. He is ambulatory and able to squat and stand in the room   Skin:    General: Skin is warm and dry.  Neurological:     Mental Status: He is alert.    ED Results / Procedures / Treatments   Labs (all labs ordered are listed, but only  abnormal results are displayed) Labs Reviewed - No data to display  EKG None  Radiology No results found.  Procedures Procedures   Medications Ordered in ED Medications - No data to display  ED Course  I have reviewed the triage vital signs and the nursing notes.  Pertinent labs & imaging results that were available during my care of the patient were reviewed by me and considered in my medical decision making (see chart for details).    MDM Rules/Calculators/A&P                          Pt here with joint pain after doing heavy lifting, pushing and pulling for 11 hours a day for a week. He has no focal neuro deficits on exam. He is ambulatory with normal rom and strength of the extremities. No signs of septic arthritis on exam. No trauma to suggest fx. Feel pt would benefit from antiinflammatories meds and close f/u. I do not feel  that emergent imaging is required at this time. Advised on return precautions and pt is in agreement. All questions answered, pt stable for discharge    Final Clinical Impression(s) / ED Diagnoses Final diagnoses:  Arthralgia, unspecified joint    Rx / DC Orders ED Discharge Orders          Ordered    naproxen (NAPROSYN) 500 MG tablet  2 times daily        07/10/21 [address] [place], PA-C 07/10/21 1537    Gerhard Munch, MD 07/13/21 1719

## 2021-07-10 NOTE — Discharge Instructions (Signed)

## 2021-09-06 ENCOUNTER — Ambulatory Visit (HOSPITAL_COMMUNITY)
Admission: EM | Admit: 2021-09-06 | Discharge: 2021-09-06 | Disposition: A | Discharge status: Home / Self Care | Payer: No Payment, Other | Attending: Nurse Practitioner | Admitting: Nurse Practitioner

## 2021-09-06 DIAGNOSIS — F4323 Adjustment disorder with mixed anxiety and depressed mood: Secondary | ICD-10-CM | POA: Insufficient documentation

## 2021-09-06 DIAGNOSIS — Z560 Unemployment, unspecified: Secondary | ICD-10-CM | POA: Insufficient documentation

## 2021-09-06 MED ORDER — ESCITALOPRAM OXALATE 10 MG PO TABS: ORAL_TABLET | ORAL | 1 refills | Status: DC

## 2021-09-06 NOTE — ED Provider Notes (Signed)
Behavioral Health Urgent Care Medical Screening Exam  Patient Name: Kristopher Avila MRN: [id number] Date of Evaluation: 09/06/21 Chief Complaint:   Diagnosis:  Final diagnoses:  Adjustment disorder with mixed anxiety and depressed mood    History of Present illness: Kristopher Avila is a 40 y.o. male with no reported psychiatric history who presents to [facility] voluntarily due to depression and anxiety after losing his job of several years, approximately two months ago. Patient endorses depressive symptoms, including low mood, decreased sleep, problems with energy, problems with motivation. Endorses excessive worry. Patient reports feelings of depression occur 3-4 days per week and mostly occur when thinking of his situation. Patient denies symptoms of mania or hypomania, including euphoric mood, increased activity, impulsive/reckless behavior, racing thoughts, increased talkativeness, marked inability to focus and excessive self-confidence that lasted for several consecutive days in the past.     On evaluation patient is alert and oriented x 4, pleasant, and cooperative. Speech is clear and coherent. Mood is depressed and affect is congruent with mood. Thought process is coherent and thought content is logical. Denies auditory and visual hallucinations. No indication that patient is responding to internal stimuli. No evidence of delusional thought content. Denies suicidal ideations. Denies homicidal ideations. Denies substance abuse.    Psychiatric Specialty Exam  Presentation  General Appearance:Appropriate for Environment; Well Groomed  Eye Contact:Good  Speech:Clear and Coherent; Normal Rate  Speech Volume:Normal  Handedness:Right   Mood and Affect  Mood:Anxious; Depressed  Affect:Congruent   Thought Process  Thought Processes:Coherent  Descriptions of Associations:Intact  Orientation:Full (Time, Place and Person)  Thought Content:WDL    Hallucinations:None  Ideas of  Reference:None  Suicidal Thoughts:No  Homicidal Thoughts:No   Sensorium  Memory:Immediate Good; Recent Good  Judgment:Good  Insight:Good   Executive Functions  Concentration:Good  Attention Span:Good  Recall:Good  Fund of Knowledge:Good  Language:Good   Psychomotor Activity  Psychomotor Activity:Normal   Assets  Assets:Communication Skills; Desire for Improvement; Physical Health; Housing; Social Support   Sleep  Sleep:Fair  Number of hours: No data recorded  Nutritional Assessment (For OBS and FBC admissions only) Has the patient had a weight loss or gain of 10 pounds or more in the last 3 months?: No Has the patient had a decrease in food intake/or appetite?: No Does the patient have dental problems?: No Does the patient have eating habits or behaviors that may be indicators of an eating disorder including binging or inducing vomiting?: No Has the patient recently lost weight without trying?: 0 Has the patient been eating poorly because of a decreased appetite?: 0 Malnutrition Screening Tool Score: 0    Physical Exam: Physical Exam Constitutional:      General: He is not in acute distress.    Appearance: He is not ill-appearing, toxic-appearing or diaphoretic.  HENT:     Head: Normocephalic.     Right Ear: External ear normal.     Left Ear: External ear normal.  Eyes:     Pupils: Pupils are equal, round, and reactive to light.  Cardiovascular:     Rate and Rhythm: Normal rate.  Pulmonary:     Effort: Pulmonary effort is normal. No respiratory distress.  Musculoskeletal:        General: Normal range of motion.  Skin:    General: Skin is warm and dry.  Neurological:     Mental Status: He is alert and oriented to person, place, and time.  Psychiatric:        Mood and Affect: Mood is anxious and  depressed.        Speech: Speech normal.        Behavior: Behavior is cooperative.        Thought Content: Thought content is not paranoid or  delusional. Thought content does not include homicidal or suicidal ideation. Thought content does not include suicidal plan.   Review of Systems  Constitutional:  Negative for chills, diaphoresis, fever, malaise/fatigue and weight loss.  HENT:  Negative for congestion.   Respiratory:  Negative for cough and shortness of breath.   Cardiovascular:  Negative for chest pain and palpitations.  Gastrointestinal:  Negative for diarrhea, nausea and vomiting.  Neurological:  Negative for dizziness and seizures.  Psychiatric/Behavioral:  Positive for depression. Negative for hallucinations, memory loss, substance abuse and suicidal ideas. The patient is nervous/anxious and has insomnia.   All other systems reviewed and are negative.  Blood pressure (!) 139/92, pulse 66, temperature 98.8 F (37.1 C), temperature source Oral, resp. rate 18, SpO2 100 %. There is no height or weight on file to calculate BMI.  Musculoskeletal: Strength & Muscle Tone: within normal limits Gait & Station: normal Patient leans: N/A   Suicide Risk:  Minimal: No identifiable suicidal ideation.  Patients presenting with no risk factors but with morbid ruminations; may be classified as minimal risk based on the severity of the depressive symptoms    [facility] MSE Discharge Disposition for Follow up and Recommendations: Based on my evaluation the patient does not appear to have an emergency medical condition and can be discharged with resources and follow up care in outpatient services for Medication Management and Individual Therapy  lexapro: the patient was informed of possible adverse effects including, but not limited to: weight gain, headaches, dizziness, nausea/vomiting, abdominal pain, diarrhea/constipation, tremors, muscle pain/aches. This includes black box warning of emergence of suicidal ideations. The patient expressed understanding. Alternatives to this medication were also discussed with the patient, including risks of  not taking medications, and the patient was agreeable to the above choice.     Meds ordered this encounter  Medications   DISCONTD: escitalopram (LEXAPRO) 10 MG tablet    Sig: Take 0.5 tablets (5 mg total) by mouth daily for 7 days, THEN 1 tablet (10 mg total) daily.    Dispense:  30 tablet    Refill:  1    Order Specific Question:   Supervising Provider    Answer:   Nelly Rout [[id number]]   escitalopram (LEXAPRO) 10 MG tablet    Sig: Take 0.5 tablets (5 mg total) by mouth daily for 7 days, THEN 1 tablet (10 mg total) daily.    Dispense:  30 tablet    Refill:  1    Order Specific Question:   Supervising Provider    Answer:   Nelly Rout [[id number]]    Provided with walk-in hours for [facility].   Jackelyn Poling, NP 09/06/2021, 9:01 PM

## 2021-09-06 NOTE — Progress Notes (Signed)
°   09/06/21 2041  [facility] Triage Screening (Walk-ins at [facility] only)  How Did You Hear About [place]? Self  What Is the Reason for Your Visit/Call Today? Kristopher Avila is a 40 year old male presenting voluntary as a walk-in to [facility] requesting medication management for depression. Patient denied SI, HI, psychosis and alcohol/drug usage. 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 Patient denied receiving any outpatient mental health services. Patient reported poor sleep and inability to stay asleep. Patient resides alone. Patient reported no access to guns.  How Long Has This Been Causing You Problems? 1-6 months  Have You Recently Had Any Thoughts About Hurting Yourself? No  Are You Planning to Commit Suicide/Harm Yourself At This time? No  Have you Recently Had Thoughts About Hurting Someone Karolee Ohs? No  Are You Planning To Harm Someone At This Time? No  Are you currently experiencing any auditory, visual or other hallucinations? No  Have You Used Any Alcohol or Drugs in the Past 24 Hours? No  Do you have any current medical co-morbidities that require immediate attention? No  Clinician description of patient physical appearance/behavior: casual and cooperative  What Do You Feel Would Help You the Most Today? Treatment for Depression or other mood problem  If access to [facility] was not available, would you have sought care in the Emergency Department? No  Determination of Need Routine (7 days)  Options For Referral Medication Management;Outpatient Therapy

## 2021-09-06 NOTE — Discharge Instructions (Addendum)
  Discharge recommendations:  Patient is to take medications as prescribed. Please see information for follow-up appointment with psychiatry and therapy. Please follow up with your primary care provider for all medical related needs.   Therapy: We recommend that patient participate in individual therapy to address mental health concerns.  Medications: The patient is to contact a medical professional and/or outpatient provider to address any new side effects that develop. Patient should update outpatient providers of any new medications and/or medication changes.    Safety:  The patient should abstain from use of illicit substances/drugs and abuse of any medications. If symptoms worsen or do not continue to improve or if the patient becomes actively suicidal or homicidal then it is recommended that the patient return to the closest hospital emergency department, the [facility], or call 911 for further evaluation and treatment. National Suicide Prevention Lifeline 1-800-SUICIDE or 1-800-273-8255.  About 988 988 offers 24/7 access to trained crisis counselors who can help people experiencing mental health-related distress. People can call or text 988 or chat 988lifeline.org for themselves or if they are worried about a loved one who may need crisis support.   _______________________________________  Therapy Walk-in Hours  Monday-Wednesday: 8 AM until slots are full  Friday: 1 PM to 5 PM  For Monday to Wednesday, it is recommended that patients arrive between 7:30 AM and 7:45 AM because patients will be seen in the order of arrival.  For Friday, we ask that patients arrive between 12 PM to 12:30 PM.  Go to the second floor on arrival and check in.  **Availability is limited; therefore, patients may not be seen on the same day.**  Medication management walk-ins:  Monday to Friday: 8 AM to 11 AM.  It is recommended that patients arrive by 7:30 AM to 7:45 AM  because patients will be seen in the order of arrival.  Go to the second floor on arrival and check in.  **Availability is limited; therefore, patients may not be seen on the same day.**  

## 2021-09-15 ENCOUNTER — Telehealth (HOSPITAL_COMMUNITY): Payer: Self-pay | Admitting: Family Medicine

## 2021-09-15 NOTE — BH Assessment (Signed)
Care Management - [facility] Follow Up Discharges   Writer attempted to make contact with patient today and was unsuccessful.  Unable to leave a message.  Phone just rang.  Per chart review, patient was provided with outpatient resources.

## 2021-09-21 ENCOUNTER — Encounter (HOSPITAL_COMMUNITY): Payer: Self-pay | Admitting: Physician Assistant

## 2021-09-21 ENCOUNTER — Ambulatory Visit (INDEPENDENT_AMBULATORY_CARE_PROVIDER_SITE_OTHER): Payer: No Payment, Other | Admitting: Physician Assistant

## 2021-09-21 ENCOUNTER — Ambulatory Visit (INDEPENDENT_AMBULATORY_CARE_PROVIDER_SITE_OTHER): Payer: No Payment, Other | Admitting: Licensed Clinical Social Worker

## 2021-09-21 ENCOUNTER — Other Ambulatory Visit: Payer: Self-pay

## 2021-09-21 VITALS — BP 126/75 | HR 65 | Ht 66.0 in | Wt 211.0 lb

## 2021-09-21 DIAGNOSIS — F4321 Adjustment disorder with depressed mood: Secondary | ICD-10-CM

## 2021-09-21 DIAGNOSIS — F411 Generalized anxiety disorder: Secondary | ICD-10-CM | POA: Diagnosis not present

## 2021-09-21 NOTE — Progress Notes (Signed)
Comprehensive Clinical Assessment (CCA) Note  09/21/2021 Kristopher Avila [id number]  Chief Complaint:  Chief Complaint  Patient presents with   Depression    Lost job on Oct 28 has been feeling depressed since then    Visit Diagnosis:  adjustment disorder    Client is a 41 year old male. Client is referred by [facility] for a depression.   Client states mental health symptoms as evidenced by:   Depression Fatigue; Hopelessness; Worthlessness; Irritability; Increase/decrease in appetite; Weight gain/loss Fatigue; Hopelessness; Worthlessness; Irritability; Increase/decrease in appetite; Weight gain/loss  Duration of Depressive Symptoms Greater than two weeks Greater than two weeks  Mania None None  Anxiety None None  Psychosis None None  Trauma None None  Obsessions None None  Compulsions None None  Inattention None None  Hyperactivity/Impulsivity None None      Emotional Irregularity None None     Client denies suicidal and homicidal ideations at this time  Client denies hallucinations and delusions at this time   Client was screened for the following SDOH: Smoking, financial, exercise, stress\tension, depression, and housing.  Assessment Information that integrates subjective and objective details with a therapist's professional interpretation:    Patient was alert and oriented x5.  Patient was pleasant, cooperative, and maintained good eye contact.  He engaged well in therapy session and was dressed casually.  Kristopher Avila presented today with depressed mood\affect.  Patient was referred from behavioral health urgent care on September 06, 2021.  Patient was placed on Lexapro for depression due to loss of job in late October 2022.  Patient reports that his depression has been increasing since he has not been able to obtain a job.  Patient endorses symptoms for isolation, lack of motivation, worthlessness, hopelessness, irritability, and insomnia.  Patient reports that he lost his job because he was  praying too much at work.  Patient is a practicing Muslim and has to pray multiple times per day.Kristopher Avila states that there was a new "boss" and the old boss let them pray outside of breaks.  But the new boss wanted them to pray only during their breaks.  Patient reports that he was fired for this and has been unable to get employment since October due to his depression and lack of motivation.  Patient reports little to no difference since starting Lexapro.  Patient does have good support through his wife and through his religion.  Plan for patient is to follow-up with medication management walk-ins on September 22, 2021.  Patient will follow-up with this LCSW every 4 weeks moving forward.  LCSW did provide patient with resources for [place] works for employment resources.   Client meets criteria for: Adjustment disorder primarily depressed Client states use of the following substances: None reported    Client provided information on [place] work  Clinician assisted client with scheduling the following appointments: 4 weeks. Clinician details of appointment.    Client was in agreement with treatment recommendations.   CCA Screening, Triage and Referral (STR)  Patient Reported Information How did you hear about [place]? Self  Referral name: [facility]   What Is the Reason for Your Visit/Call Today? Pt comes in as a [facility] referral after losing his job 4 months ago. Pt reports feeling bad about himself due to lack of employment. He was put on medication lexapro and told to follow up with outpatient services in 7 days  How Long Has This Been Causing You Problems? 1-6 months  What Do You Feel Would Help You the Most Today?  Treatment for Depression or other mood problem; Medication(s)   Have You Recently Been in Any Inpatient Treatment (Hospital/Detox/Crisis Center/28-Day Program)? No  Have You Ever Received Services From [facility] Before? Yes  Who Do You See at [facility]? [facility]   Have You Recently  Had Any Thoughts About Hurting Yourself? No  Are You Planning to Commit Suicide/Harm Yourself At This time? No   Have you Recently Had Thoughts About Hurting Someone Karolee Ohs? No    Have You Used Any Alcohol or Drugs in the Past 24 Hours? No    Do You Currently Have a Therapist/Psychiatrist? No    Have You Been Recently Discharged From Any Office Practice or Programs? No      CCA Screening Triage Referral Assessment Type of Contact: Face-to-Face    Is CPS involved or ever been involved? Never  Is APS involved or ever been involved? Never   Patient Determined To Be At Risk for Harm To Self or Others Based on Review of Patient Reported Information or Presenting Complaint? No    Location of Assessment: [facility]   Does Patient Present under Involuntary Commitment? No data recorded IVC Papers Initial File Date: No data recorded  [place] of Residence: No data recorded  Patient Currently Receiving the Following Services: No data recorded  Determination of Need: Routine (7 days)   Options For Referral: Medication Management; Outpatient Therapy     CCA Biopsychosocial Intake/Chief Complaint:  depression  Current Symptoms/Problems: isolation, lack of motivation irratbility   Patient Reported Schizophrenia/Schizoaffective Diagnosis in Past: No data recorded  Strengths: willing to engage in treatment  Preferences: male therapist  Abilities: gym, soccer, travling   Type of Services Patient Feels are Needed: therapy and medication mgnt   Initial Clinical Notes/Concerns: No data recorded  Mental Health Symptoms Depression:   Fatigue; Hopelessness; Worthlessness; Irritability; Increase/decrease in appetite; Weight gain/loss   Duration of Depressive symptoms:  Greater than two weeks   Mania:   None   Anxiety:    None   Psychosis:   None   Duration of Psychotic symptoms: No data recorded  Trauma:   None   Obsessions:   None    Compulsions:   None   Inattention:   None   Hyperactivity/Impulsivity:   None   Oppositional/Defiant Behaviors:  No data recorded  Emotional Irregularity:   None   Other Mood/Personality Symptoms:  No data recorded   Mental Status Exam Appearance and self-care  Stature:   Average   Weight:   Overweight   Clothing:   Casual   Grooming:   Normal   Cosmetic use:   None   Posture/gait:   Normal   Motor activity:   Not Remarkable   Sensorium  Attention:   Normal   Concentration:   Normal   Orientation:   X5   Recall/memory:   Normal   Affect and Mood  Affect:   Depressed   Mood:   Depressed   Relating  Eye contact:  No data recorded  Facial expression:   Depressed   Attitude toward examiner:   Cooperative   Thought and Language  Speech flow:  Clear and Coherent   Thought content:   Appropriate to Mood and Circumstances   Preoccupation:   None   Hallucinations:   None   Organization:  No data recorded  [employer] of Knowledge:   Fair   Intelligence:   Average   Abstraction:   Functional   Judgement:  Fair   Reality Testing:  No data recorded  Insight:   Good   Decision Making:   Normal   Social Functioning  Social Maturity:   Responsible   Social Judgement:   Normal   Stress  Stressors:   Work   Coping Ability:   Human resources officerverwhelmed   Skill Deficits:   Activities of daily living   Supports:   Family; Church     Religion: Religion/Spirituality Are You A Religious Person?: Yes What is Your Religious Affiliation?: Muslim  Leisure/Recreation: Leisure / Recreation Do You Have Hobbies?: Yes Leisure and Hobbies: Advice workersports/soccer and gym  Exercise/Diet: Exercise/Diet Do You Exercise?: No Have You Gained or Lost A Significant Amount of Weight in the Past Six Months?: Yes-Gained Number of Pounds Gained: 6 Do You Follow a Special Diet?: No Do You Have Any Trouble Sleeping?: Yes Explanation  of Sleeping Difficulties: sleeping too little.   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?: No  Education: Education Is Patient Currently Attending School?: No Last Grade Completed: 12 Did Garment/textile technologistYou Graduate From [employer] School?: Yes Did Theme park managerYou Attend College?: Yes What Type of College Degree Do you Have?: Master in public adminstration Did You Attend Graduate School?: No Did You Have An Individualized Education Program (IIEP): No Did You Have Any Difficulty At School?: No Patient's Education Has Been Impacted by Current Illness: No   CCA Family/Childhood History Family and Relationship History: Family history Marital status: Married Number of Years Married: 4 What types of issues is patient dealing with in the relationship?: none Additional relationship information: some arugment but nothing serious Are you sexually active?: Yes What is your sexual orientation?: hetrosexual Has your sexual activity been affected by drugs, alcohol, medication, or emotional stress?: none reported Does patient have children?: Yes How many children?: 1 How is patient's relationship with their children?: son: good  Childhood History:  Childhood History By whom was/is the patient raised?: Both parents Description of patient's relationship with caregiver when they were a child: good with both mom and dad Patient's description of current relationship with people who raised him/her: still good. Does patient have siblings?: Yes Number of Siblings: 6 Description of patient's current relationship with siblings: good Did patient suffer any verbal/emotional/physical/sexual abuse as a child?: No Did patient suffer from severe  childhood neglect?: No Has patient ever been sexually abused/assaulted/raped as an adolescent or adult?: No Was the patient ever a victim of a crime or a disaster?: No Witnessed domestic violence?: No Has patient been affected by domestic violence as an adult?: No  Child/Adolescent Assessment:     CCA Substance Use Alcohol/Drug Use: Alcohol / Drug Use History of alcohol / drug use?: No history of alcohol / drug abuse      DSM5 Diagnoses: Patient Active Problem List   Diagnosis Date Noted   Adjustment disorder with depressed mood 09/21/2021      Weber CooksAdam S Tremaine Earwood, LCSW

## 2021-09-21 NOTE — Plan of Care (Signed)
Pt agreeable to plan  ?

## 2021-09-21 NOTE — Progress Notes (Signed)
Psychiatric Initial Adult Assessment   Patient Identification: Kristopher Avila MRN:  [id number] Date of Evaluation:  09/21/2021 Referral Source: Behavioral Health Urgent Care Chief Complaint:   Chief Complaint   Walk-In    Visit Diagnosis:    ICD-10-CM   1. Adjustment disorder with depressed mood  F43.21     2. Generalized anxiety disorder  F41.1       History of Present Illness:    Kristopher Avila is a 41 year old male with a past psychiatric history significant for adjustment disorder with depressed mood and generalized anxiety disorder who presents to [facility] as a walk-in for medication management.  Patient was previously seen at [facility] urgent Care due to depression and anxiety after losing his job.  After his assessment at [facility], patient was placed on Lexapro 5 mg for 7 days followed by 10 mg daily.  Patient is continuing to take his Lexapro as prescribed.  Patient reports still having some pills left and inquires if he will need to continue taking the medication.  He reports that he continues to experience depression from time to time and confirms that his depression was due to losing his job after another employee was hired in his place.  Patient endorses anxiety and rates his anxiety a 7 out of 10.  He denies a history of panic attacks.  Patient denies a past history of hospitalization due to mental health.  He further denies past suicide attempt nor has he engaged in self-harm.  A PHQ-9 screen was performed with the patient scoring a 9.  A GAD-7 screen was also performed with the patient scoring a 15.  Patient is alert and oriented x4, calm, cooperative, and fully engaged in conversation during the encounter.  Patient denies suicidal or homicidal ideations.  He further denies auditory or visual hallucinations and does not appear to be responding to internal/external stimuli.  Patient endorses fair sleep and receives on average 4  to 5 hours of sleep each night.  Patient endorses decreased appetite and eats on average 1 meal per day.  Patient denies alcohol consumption, tobacco use, and illicit drug use.  Associated Signs/Symptoms: Depression Symptoms:  depressed mood, anhedonia, insomnia, psychomotor agitation, psychomotor retardation, fatigue, feelings of worthlessness/guilt, difficulty concentrating, anxiety, loss of energy/fatigue, disturbed sleep, weight loss, decreased labido, decreased appetite, (Hypo) Manic Symptoms:  Flight of Ideas, Labiality of Mood, Anxiety Symptoms:  Agoraphobia, Excessive Worry, Psychotic Symptoms:   None PTSD Symptoms: Had a traumatic exposure:  Patient reports that losing his job was extremely difficult for him. Had a traumatic exposure in the last month:  N/A Re-experiencing:  Nightmares Hypervigilance:  No Hyperarousal:  None Avoidance:  Decreased Interest/Participation  Past Psychiatric History:  Adjustment disorder with depressed mood Generalized anxiety disorder  Previous Psychotropic Medications: Yes   Substance Abuse History in the last 12 months:  No.  Consequences of Substance Abuse: Negative  Past Medical History: History reviewed. No pertinent past medical history. History reviewed. No pertinent surgical history.  Family Psychiatric History:  Patient denies a family history of psychiatric illness  Family History:  Family History  Problem Relation Age of Onset   Hypertension Mother    Diabetes Father     Social History:   Social History   Socioeconomic History   Marital status: Married    Spouse name: Not on file   Number of children: Not on file   Years of education: Not on file   Highest education level: Not  on file  Occupational History   Not on file  Tobacco Use   Smoking status: Every Day    Packs/day: 0.50    Types: Cigarettes   Smokeless tobacco: Current  Vaping Use   Vaping Use: Never used  Substance and Sexual Activity    Alcohol use: Not Currently   Drug use: Not Currently   Sexual activity: Yes  Other Topics Concern   Not on file  Social History Narrative   Not on file   Social Determinants of Health   Financial Resource Strain: High Risk   Difficulty of Paying Living Expenses: Hard  Food Insecurity: No Food Insecurity   Worried About Running Out of Food in the Last Year: Never true   Ran Out of Food in the Last Year: Never true  Transportation Needs: No Transportation Needs   Lack of Transportation (Medical): No   Lack of Transportation (Non-Medical): No  Physical Activity: Inactive   Days of Exercise per Week: 0 days   Minutes of Exercise per Session: 0 min  Stress: Stress Concern Present   Feeling of Stress : Very much  Social Connections: Moderately Integrated   Frequency of Communication with Friends and Family: More than three times a week   Frequency of Social Gatherings with Friends and Family: Never   Attends Religious Services: More than 4 times per year   Active Member of [employer] Financial[employer]ubs or Organizations: No   Attends BankerClub or Organization Meetings: Never   Marital Status: Married    Additional Social History:  Patient is currently unemployed.  He endorses social support via family.  Allergies:  No Known Allergies  Metabolic Disorder Labs: No results found for: HGBA1C, MPG No results found for: PROLACTIN Lab Results  Component Value Date   CHOL 242 (H) 01/20/2020   TRIG 88 01/20/2020   HDL 44 01/20/2020   CHOLHDL 5.5 (H) 01/20/2020   LDLCALC 183 (H) 01/20/2020   Lab Results  Component Value Date   TSH 1.900 01/20/2020    Therapeutic Level Labs: No results found for: LITHIUM No results found for: CBMZ No results found for: VALPROATE  Current Medications: Current Outpatient Medications  Medication Sig Dispense Refill   escitalopram (LEXAPRO) 10 MG tablet Take 0.5 tablets (5 mg total) by mouth daily for 7 days, THEN 1 tablet (10 mg total) daily. 30 tablet 1   No current  facility-administered medications for this visit.    Musculoskeletal: Strength & Muscle Tone: within normal limits Gait & Station: normal Patient leans: N/A  Psychiatric Specialty Exam: Review of Systems  Psychiatric/Behavioral:  Positive for decreased concentration and sleep disturbance. Negative for dysphoric mood, hallucinations, self-injury and suicidal ideas. The patient is nervous/anxious. The patient is not hyperactive.    Blood pressure 126/75, pulse 65, height 5\' 6"  (1.676 m), weight 211 lb (95.7 kg).Body mass index is 34.06 kg/m.  General Appearance: Casual  Eye Contact:  Good  Speech:  Clear and Coherent and Normal Rate  Volume:  Normal  Mood:  Anxious and Depressed  Affect:  Congruent  Thought Process:  Coherent, Goal Directed, and Descriptions of Associations: Intact  Orientation:  Full (Time, Place, and Person)  Thought Content:  WDL  Suicidal Thoughts:  No  Homicidal Thoughts:  No  Memory:  Immediate;   Good Recent;   Good Remote;   Good  Judgement:  Good  Insight:  Good  Psychomotor Activity:  Normal  Concentration:  Concentration: Good and Attention Span: Good  Recall:  Good  Fund of  Knowledge:Good  Language: Good  Akathisia:  No  Handed:  Right  AIMS (if indicated):  not done  Assets:  Communication Skills Desire for Improvement Housing Social Support  ADL's:  Intact  Cognition: WNL  Sleep:  Fair   Screenings: GAD-7    Flowsheet Row Office Visit from 09/21/2021 in [facility]  Total GAD-7 Score 15      PHQ2-9    Flowsheet Row Counselor from 09/21/2021 in [facility] Office Visit from 01/23/2020 in Primary Care at [facility] Visit from 08/28/2019 in Primary Care at [facility] Visit from 08/04/2019 in Primary Care at [facility] Total Score 5 0 0 0  PHQ-9 Total Score 9 -- -- --      Flowsheet Row Counselor from 09/21/2021 in [facility] ED from  07/10/2021 in [facility]  C-SSRS RISK CATEGORY No Risk No Risk       Assessment and Plan:   Kristopher Avila is a 41 year old male with a past psychiatric history significant for adjustment disorder with depressed mood and generalized anxiety disorder who presents to [facility] as a walk-in for medication management.  Currently taking Lexapro 10 mg daily for the management of his depressive symptoms and anxiety.  Patient denies any adverse side effects from the medication.  Patient to continue taking medication as prescribed.  1. Adjustment disorder with depressed mood Patient to continue taking Lexapro 10 mg daily for the management of his adjustment disorder with depressed mood  2. Generalized anxiety disorder Patient to continue taking Lexapro 10 mg daily for the management of his generalized anxiety disorder  Patient to follow up in 6 weeks Provider spent a total of 33 minutes with the patient/reviewing patient's chart  Meta Hatchet, PA 1/11/20239:32 AM

## 2021-09-23 ENCOUNTER — Encounter (HOSPITAL_COMMUNITY): Payer: Self-pay | Admitting: Physician Assistant

## 2021-10-19 ENCOUNTER — Other Ambulatory Visit: Payer: Self-pay

## 2021-10-19 ENCOUNTER — Ambulatory Visit (INDEPENDENT_AMBULATORY_CARE_PROVIDER_SITE_OTHER): Payer: No Payment, Other | Admitting: Licensed Clinical Social Worker

## 2021-10-19 DIAGNOSIS — F4321 Adjustment disorder with depressed mood: Secondary | ICD-10-CM

## 2021-10-19 NOTE — Progress Notes (Signed)
° °  THERAPIST PROGRESS NOTE  Session Time: 71  Participation Level: Active  Behavioral Response: CasualAlertAnxious and Depressed  Type of Therapy: Individual Therapy  Treatment Goals addressed: Anxiety  Interventions: CBT  Summary: Kristopher Avila is a 41 y.o. male who presents with anxious and depressed mood\affect.  Patient was pleasant, cooperative, maintained good eye contact.  He engaged well in therapy session and was dressed casually.  Primary stressor for patient's his financials, work, and insomnia.  Patient reports that he is only getting 4 to 5 hours of sleep per night.  He states that he has been interested in trying an over the counter regiment as a sleep aid for melatonin.  LCSW recommended that he speak with his medication provider before starting any over-the-counter medications as a precaution for side effects.  LCSW also utilized education for meditation therapy in today's session.  LCSW provided resources for meditation video "Daily calm".  This is to give patient an alternative to medication management were sleeping aid.  Other stressors for patient to work.  Patient reports that he was fired due to his times during his breaks that he would pray due to him being a practicing Muslim.  Patient reports that he has applied to multiple jobs and even had a job last week but ended up quitting because he did not like it.  He reports that he has 2 interviews in the upcoming week for new jobs.   Suicidal/Homicidal: Nowithout intent/plan  Therapist Response:    Intervention\plan: LCSW utilized motivational interviewing, medication education, psychotherapy, and supportive therapy in today's session.  LCSW used language for praise, encouragement, advocacy, and empowerment in today's session.  LCSW utilized reflective listening, positive affirmations, and open-ended questions.  Plan for patient is to utilize meditation video 1 time before bed for at least 4 days out of the 7/week.  LCSW  administered GAD-7 patient decrease GAD-7 x 6 points.  LCSW also administered PHQ-9 patient increases PHQ-9 score by 4 points.  Plan: Return again in 4 weeks.      Weber Cooks, LCSW 10/19/2021

## 2021-10-28 ENCOUNTER — Encounter (HOSPITAL_COMMUNITY): Payer: Self-pay

## 2021-10-28 ENCOUNTER — Encounter (HOSPITAL_COMMUNITY): Payer: No Payment, Other | Admitting: Physician Assistant

## 2021-11-09 ENCOUNTER — Ambulatory Visit (HOSPITAL_COMMUNITY): Payer: No Payment, Other | Admitting: Licensed Clinical Social Worker

## 2021-12-06 ENCOUNTER — Ambulatory Visit (HOSPITAL_COMMUNITY): Payer: No Payment, Other | Admitting: Licensed Clinical Social Worker

## 2023-07-25 ENCOUNTER — Encounter: Payer: Self-pay | Admitting: Family Medicine

## 2023-07-26 ENCOUNTER — Telehealth: Payer: Self-pay | Admitting: Family Medicine

## 2023-07-26 NOTE — Telephone Encounter (Signed)
Caller name: Darden Palmer  On DPR?: Yes  Call back number: [phone] (home)  Provider they see: Shade Flood, MD  Reason for call:   Pt is asking to est care. Hasn't been here since May 2021

## 2023-07-26 NOTE — Telephone Encounter (Signed)
Okay to schedule visit, reestablish care.  Thanks.

## 2023-07-26 NOTE — Telephone Encounter (Signed)
Pt has not been seen in just over 3 years are you willing to see him again?

## 2023-09-06 ENCOUNTER — Encounter: Payer: Self-pay | Admitting: Family Medicine

## 2023-09-06 ENCOUNTER — Ambulatory Visit: Payer: PRIVATE HEALTH INSURANCE | Admitting: Family Medicine

## 2023-09-06 VITALS — BP 112/72 | HR 68 | Temp 98.5°F | Ht 65.0 in | Wt 193.1 lb

## 2023-09-06 DIAGNOSIS — R739 Hyperglycemia, unspecified: Secondary | ICD-10-CM

## 2023-09-06 DIAGNOSIS — Z1329 Encounter for screening for other suspected endocrine disorder: Secondary | ICD-10-CM

## 2023-09-06 DIAGNOSIS — Z0000 Encounter for general adult medical examination without abnormal findings: Secondary | ICD-10-CM

## 2023-09-06 DIAGNOSIS — E785 Hyperlipidemia, unspecified: Secondary | ICD-10-CM

## 2023-09-06 DIAGNOSIS — Z130 Encounter for screening for diseases of the blood and blood-forming organs and certain disorders involving the immune mechanism: Secondary | ICD-10-CM

## 2023-09-06 DIAGNOSIS — F17210 Nicotine dependence, cigarettes, uncomplicated: Secondary | ICD-10-CM

## 2023-09-06 LAB — CBC
HCT: 46.5 % (ref 39.0–52.0)
Hemoglobin: 15.5 g/dL (ref 13.0–17.0)
MCHC: 33.3 g/dL (ref 30.0–36.0)
MCV: 89.7 fL (ref 78.0–100.0)
Platelets: 266 10*3/uL (ref 150.0–400.0)
RBC: 5.19 Mil/uL (ref 4.22–5.81)
RDW: 13.8 % (ref 11.5–15.5)
WBC: 7.7 10*3/uL (ref 4.0–10.5)

## 2023-09-06 LAB — TSH: TSH: 1.79 u[IU]/mL (ref 0.35–5.50)

## 2023-09-06 LAB — COMPREHENSIVE METABOLIC PANEL
ALT: 63 U/L — ABNORMAL HIGH (ref 0–53)
AST: 33 U/L (ref 0–37)
Albumin: 4.7 g/dL (ref 3.5–5.2)
Alkaline Phosphatase: 57 U/L (ref 39–117)
BUN: 11 mg/dL (ref 6–23)
CO2: 29 meq/L (ref 19–32)
Calcium: 9.7 mg/dL (ref 8.4–10.5)
Chloride: 99 meq/L (ref 96–112)
Creatinine, Ser: 0.69 mg/dL (ref 0.40–1.50)
GFR: 112.79 mL/min (ref >60.00)
Glucose, Bld: 90 mg/dL (ref 70–99)
Potassium: 3.8 meq/L (ref 3.5–5.1)
Sodium: 134 meq/L — ABNORMAL LOW (ref 135–145)
Total Bilirubin: 1 mg/dL (ref 0.2–1.2)
Total Protein: 7.5 g/dL (ref 6.0–8.3)

## 2023-09-06 LAB — LIPID PANEL
Cholesterol: 262 mg/dL — ABNORMAL HIGH (ref 0–200)
HDL: 46.5 mg/dL (ref >39.00)
LDL Cholesterol: 183 mg/dL — ABNORMAL HIGH (ref 0–99)
NonHDL: 215.12
Total CHOL/HDL Ratio: 6
Triglycerides: 162 mg/dL — ABNORMAL HIGH (ref 0.0–149.0)
VLDL: 32.4 mg/dL (ref 0.0–40.0)

## 2023-09-06 LAB — HEMOGLOBIN A1C: Hgb A1c MFr Bld: 6.2 % (ref 4.6–6.5)

## 2023-09-06 MED ORDER — VARENICLINE TARTRATE (STARTER) 0.5 MG X 11 & 1 MG X 42 PO TBPK: ORAL_TABLET | ORAL | 0 refills | Status: AC

## 2023-09-06 MED ORDER — VARENICLINE TARTRATE 1 MG PO TABS: 1.0000 mg | ORAL_TABLET | Freq: Two times a day (BID) | ORAL | 2 refills | Status: AC

## 2023-09-06 NOTE — Patient Instructions (Addendum)
Chantix to help with quitting smoking. Let me know if any new side effects as we discussed.  If any concerns on labs I will let you know.  I do recommend a dentist visit.  Take care!  Preventive Care 28-44 Years Old, Male Preventive care refers to lifestyle choices and visits with your health care provider that can promote health and wellness. Preventive care visits are also called wellness exams. What can I expect for my preventive care visit? Counseling During your preventive care visit, your health care provider may ask about your: Medical history, including: Past medical problems. Family medical history. Current health, including: Emotional well-being. Home life and relationship well-being. Sexual activity. Lifestyle, including: Alcohol, nicotine or tobacco, and drug use. Access to firearms. Diet, exercise, and sleep habits. Safety issues such as seatbelt and bike helmet use. Sunscreen use. Work and work Astronomer. Physical exam Your health care provider will check your: Height and weight. These may be used to calculate your BMI (body mass index). BMI is a measurement that tells if you are at a healthy weight. Waist circumference. This measures the distance around your waistline. This measurement also tells if you are at a healthy weight and may help predict your risk of certain diseases, such as type 2 diabetes and high blood pressure. Heart rate and blood pressure. Body temperature. Skin for abnormal spots. What immunizations do I need?  Vaccines are usually given at various ages, according to a schedule. Your health care provider will recommend vaccines for you based on your age, medical history, and lifestyle or other factors, such as travel or where you work. What tests do I need? Screening Your health care provider may recommend screening tests for certain conditions. This may include: Lipid and cholesterol levels. Diabetes screening. This is done by checking your  blood sugar (glucose) after you have not eaten for a while (fasting). Hepatitis B test. Hepatitis C test. HIV (human immunodeficiency virus) test. STI (sexually transmitted infection) testing, if you are at risk. Lung cancer screening. Prostate cancer screening. Colorectal cancer screening. Talk with your health care provider about your test results, treatment options, and if necessary, the need for more tests. Follow these instructions at home: Eating and drinking  Eat a diet that includes fresh fruits and vegetables, whole grains, lean protein, and low-fat dairy products. Take vitamin and mineral supplements as recommended by your health care provider. Do not drink alcohol if your health care provider tells you not to drink. If you drink alcohol: Limit how much you have to 0-2 drinks a day. Know how much alcohol is in your drink. In the [place] one drink equals one 12 oz bottle of beer (355 mL), one 5 oz glass of wine (148 mL), or one 1 oz glass of hard liquor (44 mL). Lifestyle Brush your teeth every morning and night with fluoride toothpaste. Floss one time each day. Exercise for at least 30 minutes 5 or more days each week. Do not use any products that contain nicotine or tobacco. These products include cigarettes, chewing tobacco, and vaping devices, such as e-cigarettes. If you need help quitting, ask your health care provider. Do not use drugs. If you are sexually active, practice safe sex. Use a condom or other form of protection to prevent STIs. Take aspirin only as told by your health care provider. Make sure that you understand how much to take and what form to take. Work with your health care provider to find out whether it is safe and beneficial  for you to take aspirin daily. Find healthy ways to manage stress, such as: Meditation, yoga, or listening to music. Journaling. Talking to a trusted person. Spending time with friends and family. Minimize exposure to UV radiation  to reduce your risk of skin cancer. Safety Always wear your seat belt while driving or riding in a vehicle. Do not drive: If you have been drinking alcohol. Do not ride with someone who has been drinking. When you are tired or distracted. While texting. If you have been using any mind-altering substances or drugs. Wear a helmet and other protective equipment during sports activities. If you have firearms in your house, make sure you follow all gun safety procedures. What's next? Go to your health care provider once a year for an annual wellness visit. Ask your health care provider how often you should have your eyes and teeth checked. Stay up to date on all vaccines. This information is not intended to replace advice given to you by your health care provider. Make sure you discuss any questions you have with your health care provider. Document Revised: 02/23/2021 Document Reviewed: 02/23/2021 Elsevier Patient Education  2024 Elsevier Inc.   Steps to Quit Smoking Smoking tobacco is the leading cause of preventable death. It can affect almost every organ in the body. Smoking puts you and those around you at risk for developing many serious chronic diseases. Quitting smoking can be very challenging. Do not get discouraged if you are not successful the first time. Some people need to make many attempts to quit before they achieve long-term success. Do your best to stick to your quit plan, and talk with your health care provider if you have any questions or concerns. How do I get ready to quit? When you decide to quit smoking, create a plan to help you succeed. Before you quit: Pick a date to quit. Set a date within the next 2 weeks to give you time to prepare. Write down the reasons why you are quitting. Keep this list in places where you will see it often. Tell your family, friends, and co-workers that you are quitting. Support from people you are close to can make quitting easier. Talk with  your health care provider about your options for quitting smoking. Find out what treatment options are covered by your health insurance. Identify people, places, things, and activities that make you want to smoke (triggers). Avoid them. What first steps can I take to quit smoking? Throw away all cigarettes at home, at work, and in your car. Throw away smoking accessories, such as Set designer. Clean your car. Make sure to empty the ashtray. Clean your home, including curtains and carpets. What strategies can I use to quit smoking? Talk with your health care provider about combining strategies, such as taking medicines while you are also receiving in-person counseling. Using these two strategies together makes you more likely to succeed in quitting than if you used either strategy on its own. If you are pregnant or breastfeeding, talk with your health care provider about finding counseling or other support strategies to quit smoking. Do not take medicine to help you quit smoking unless your health care provider tells you to. Quit right away Quit smoking completely, instead of gradually reducing how much you smoke over a period of time. Stopping smoking right away may be more successful than gradually quitting. Attend in-person counseling to help you build problem-solving skills. You are more likely to succeed in quitting if you attend counseling  sessions regularly. Even short sessions of 10 minutes can be effective. Take medicine You may take medicines to help you quit smoking. Some medicines require a prescription. You can also purchase over-the-counter medicines. Medicines may have nicotine in them to replace the nicotine in cigarettes. Medicines may: Help to stop cravings. Help to relieve withdrawal symptoms. Your health care provider may recommend: Nicotine patches, gum, or lozenges. Nicotine inhalers or sprays. Non-nicotine medicine that you take by mouth. Find resources Find  resources and support systems that can help you quit smoking and remain smoke-free after you quit. These resources are most helpful when you use them often. They include: Online chats with a Veterinary surgeon. Telephone quitlines. Printed Materials engineer. Support groups or group counseling. Text messaging programs. Mobile phone apps or applications. Use apps that can help you stick to your quit plan by providing reminders, tips, and encouragement. Examples of free services include Quit Guide from the CDC and smokefree.gov  What can I do to make it easier to quit?  Reach out to your family and friends for support and encouragement. Call telephone quitlines, such as 1-800-QUIT-NOW, reach out to support groups, or work with a counselor for support. Ask people who smoke to avoid smoking around you. Avoid places that trigger you to smoke, such as bars, parties, or smoke-break areas at work. Spend time with people who do not smoke. Lessen the stress in your life. Stress can be a smoking trigger for some people. To lessen stress, try: Exercising regularly. Doing deep-breathing exercises. Doing yoga. Meditating. What benefits will I see if I quit smoking? Over time, you should start to see positive results, such as: Improved sense of smell and taste. Decreased coughing and sore throat. Slower heart rate. Lower blood pressure. Clearer and healthier skin. The ability to breathe more easily. Fewer sick days. Summary Quitting smoking can be very challenging. Do not get discouraged if you are not successful the first time. Some people need to make many attempts to quit before they achieve long-term success. When you decide to quit smoking, create a plan to help you succeed. Quit smoking right away, not slowly over a period of time. Find resources and support systems that can help you quit smoking and remain smoke-free after you quit. This information is not intended to replace advice given to you by  your health care provider. Make sure you discuss any questions you have with your health care provider. Document Revised: 08/19/2021 Document Reviewed: 08/19/2021 Elsevier Patient Education  2024 [employer].

## 2023-09-06 NOTE — Progress Notes (Signed)
Subjective:  Patient ID: Kristopher Avila, male    DOB: 1981-08-26  Age: 42 y.o. MRN: [id number]  CC:  Chief Complaint  Patient presents with   Annual Exam    Pt is here for annual exam Pt reports no concerns Pt reports he is not FASTING    HPI Kristopher Avila presents for Annual Exam, last ate 3 hrs ago - not fasting.  I last saw him for a physical in May 2021. Denies any barriers to care.  Friend had a stroke recently - motivated for return to care.  At that time he was smoking 1/2 pack/day for the previous 10 years, 1 prior quit attempt 6 years prior.  Also has some elevated lipids on screening labs but 10-year ASCVD risk was 7.3%.  Borderline hyperglycemia with glucose on fasting labs of 100 in May 2021.  Appears he was evaluated for adjustment disorder in December 2022 - due to losing job, treated with counseling, and it appears Lexapro had initially been prescribed. Doing well now after therapy and off lexapro for a few years and still doing well.  No current prescription meds. No health changes.      09/06/2023    1:06 PM 10/19/2021    8:15 AM 09/21/2021    8:23 AM 01/23/2020    2:07 PM 08/28/2019    2:32 PM  Depression screen PHQ 2/9  Decreased Interest 2   0 0  Down, Depressed, Hopeless 0   0 0  PHQ - 2 Score 2   0 0  Altered sleeping 0      Tired, decreased energy 0      Change in appetite 0      Feeling bad or failure about yourself  0      Trouble concentrating 0      Moving slowly or fidgety/restless 0      Suicidal thoughts 0      PHQ-9 Score 2      Difficult doing work/chores Not difficult at all         Information is confidential and restricted. Go to Review Flowsheets to unlock data.    Health Maintenance  Topic Date Due   Hepatitis C Screening  Never done   DTaP/Tdap/Td (1 - Tdap) Never done   INFLUENZA VACCINE  Never done   COVID-19 Vaccine (1 - 2024-25 season) Never done   HIV Screening  Completed   HPV VACCINES  Aged Out  No FH of cancer.   There is no  immunization history on file for this patient. Flu vaccine - declined Covid vaccine - declined.  Tdap - declined.   No results found. No optho, no corrective lenses.   Dental:due for visit.   Alcohol: none  Tobacco: 1/2 ppd. Considering quittinq. Interested in Chantix. Risks/benefits, side effects and timing discussed.   Exercise: walking in park most day , and now in gym 2-3 times per week.   Declines sti screening.    History Patient Active Problem List   Diagnosis Date Noted   Adjustment disorder with depressed mood 09/21/2021   Generalized anxiety disorder 09/21/2021   History reviewed. No pertinent past medical history. History reviewed. No pertinent surgical history. No Known Allergies Prior to Admission medications   Medication Sig Start Date End Date Taking? Authorizing Provider  escitalopram (LEXAPRO) 10 MG tablet Take 0.5 tablets (5 mg total) by mouth daily for 7 days, THEN 1 tablet (10 mg total) daily. 09/07/21 09/14/22  Jackelyn Poling, NP  Social History   Socioeconomic History   Marital status: Married    Spouse name: Not on file   Number of children: Not on file   Years of education: Not on file   Highest education level: Bachelor's degree (e.g., BA, AB, BS)  Occupational History   Not on file  Tobacco Use   Smoking status: Every Day    Current packs/day: 0.50    Types: Cigarettes   Smokeless tobacco: Current  Vaping Use   Vaping status: Never Used  Substance and Sexual Activity   Alcohol use: Not Currently   Drug use: Not Currently   Sexual activity: Yes  Other Topics Concern   Not on file  Social History Narrative   Not on file   Social Drivers of Health   Financial Resource Strain: High Risk (09/02/2023)   Overall Financial Resource Strain (CARDIA)    Difficulty of Paying Living Expenses: Very hard  Food Insecurity: Food Insecurity Present (09/02/2023)   Hunger Vital Sign    Worried About Running Out of Food in the Last Year:  Sometimes true    Ran Out of Food in the Last Year: Sometimes true  Transportation Needs: No Transportation Needs (09/02/2023)   PRAPARE - Administrator, Civil Service (Medical): No    Lack of Transportation (Non-Medical): No  Physical Activity: Insufficiently Active (09/02/2023)   Exercise Vital Sign    Days of Exercise per Week: 3 days    Minutes of Exercise per Session: 40 min  Stress: No Stress Concern Present (09/02/2023)   [employer] of Occupational Health - Occupational Stress Questionnaire    Feeling of Stress : Only a little  Social Connections: Unknown (09/02/2023)   Social Connection and Isolation Panel [NHANES]    Frequency of Communication with Friends and Family: More than three times a week    Frequency of Social Gatherings with Friends and Family: Once a week    Attends Religious Services: 1 to 4 times per year    Active Member of [employer] or Organizations: No    Attends Banker Meetings: Not on file    Marital Status: Patient declined  Intimate Partner Violence: Not At Risk (09/21/2021)   Humiliation, Afraid, Rape, and Kick questionnaire    Fear of Current or Ex-Partner: No    Emotionally Abused: No    Physically Abused: No    Sexually Abused: No    Review of Systems 13 point review of systems per patient health survey noted.  Negative other than as indicated above or in HPI.    Objective:   Vitals:   09/06/23 1307  BP: 112/72  Pulse: 68  Temp: 98.5 F (36.9 C)  SpO2: 100%  Weight: 193 lb 2 oz (87.6 kg)  Height: 5\' 5"  (1.651 m)     Physical Exam Vitals reviewed.  Constitutional:      Appearance: He is well-developed.  HENT:     Head: Normocephalic and atraumatic.     Right Ear: External ear normal.     Left Ear: External ear normal.  Eyes:     Conjunctiva/sclera: Conjunctivae normal.     Pupils: Pupils are equal, round, and reactive to light.  Neck:     Thyroid: No thyromegaly.  Cardiovascular:     Rate and  Rhythm: Normal rate and regular rhythm.     Heart sounds: Normal heart sounds.  Pulmonary:     Effort: Pulmonary effort is normal. No respiratory distress.     Breath  sounds: Normal breath sounds. No wheezing.  Abdominal:     General: There is no distension.     Palpations: Abdomen is soft.     Tenderness: There is no abdominal tenderness.  Musculoskeletal:        General: No tenderness. Normal range of motion.     Cervical back: Normal range of motion and neck supple.  Lymphadenopathy:     Cervical: No cervical adenopathy.  Skin:    General: Skin is warm and dry.  Neurological:     Mental Status: He is alert and oriented to person, place, and time.     Deep Tendon Reflexes: Reflexes are normal and symmetric.  Psychiatric:        Behavior: Behavior normal.        Assessment & Plan:  Kristopher Avila is a 42 y.o. male . Annual physical exam - Plan: TSH  - -anticipatory guidance as below in AVS, screening labs above. Health maintenance items as above in HPI discussed/recommended as applicable.   Hyperlipidemia, unspecified hyperlipidemia type - Plan: Comprehensive metabolic panel, Lipid panel  -Check labs, ASCVD score and decide on meds.  No new meds for now.  Hyperglycemia - Plan: Comprehensive metabolic panel, Hemoglobin A1c  -Prior elevation, check A1c, CMP, not fasting today.  Screening for thyroid disorder - Plan: TSH  -Prior adjustment disorder resolved, RTC precautions  Screening for iron deficiency anemia - Plan: CBC  Cigarette nicotine dependence without complication - Plan: varenicline (CHANTIX CONTINUING MONTH PAK) 1 MG tablet, Varenicline Tartrate, Starter, (CHANTIX STARTING MONTH PAK) 0.5 MG X 11 & 1 MG X 42 TBPK  -4 minutes discussing smoking cessation and options, prior attempts, and plans.  We also discussed potential side effects and risk of Chantix but he would like to try that medication.  Starter pack provided as well as continuing pack and instructions given.   Handout given on smoking cessation with RTC precautions.  Meds ordered this encounter  Medications   varenicline (CHANTIX CONTINUING MONTH PAK) 1 MG tablet    Sig: Take 1 tablet (1 mg total) by mouth 2 (two) times daily.    Dispense:  60 tablet    Refill:  2   Varenicline Tartrate, Starter, (CHANTIX STARTING MONTH PAK) 0.5 MG X 11 & 1 MG X 42 TBPK    Sig: Take one 0.5 mg tablet by mouth once daily for 3 days, then increase to one 0.5 mg tablet twice daily for 4 days, then increase to one 1 mg tablet twice daily.    Dispense:  53 each    Refill:  0   Patient Instructions  Chantix to help with quitting smoking. Let me know if any new side effects as we discussed.  If any concerns on labs I will let you know.  I do recommend a dentist visit.  Take care!  Preventive Care 72-72 Years Old, Male Preventive care refers to lifestyle choices and visits with your health care provider that can promote health and wellness. Preventive care visits are also called wellness exams. What can I expect for my preventive care visit? Counseling During your preventive care visit, your health care provider may ask about your: Medical history, including: Past medical problems. Family medical history. Current health, including: Emotional well-being. Home life and relationship well-being. Sexual activity. Lifestyle, including: Alcohol, nicotine or tobacco, and drug use. Access to firearms. Diet, exercise, and sleep habits. Safety issues such as seatbelt and bike helmet use. Sunscreen use. Work and work Astronomer. Physical exam Your  health care provider will check your: Height and weight. These may be used to calculate your BMI (body mass index). BMI is a measurement that tells if you are at a healthy weight. Waist circumference. This measures the distance around your waistline. This measurement also tells if you are at a healthy weight and may help predict your risk of certain diseases, such as type 2  diabetes and high blood pressure. Heart rate and blood pressure. Body temperature. Skin for abnormal spots. What immunizations do I need?  Vaccines are usually given at various ages, according to a schedule. Your health care provider will recommend vaccines for you based on your age, medical history, and lifestyle or other factors, such as travel or where you work. What tests do I need? Screening Your health care provider may recommend screening tests for certain conditions. This may include: Lipid and cholesterol levels. Diabetes screening. This is done by checking your blood sugar (glucose) after you have not eaten for a while (fasting). Hepatitis B test. Hepatitis C test. HIV (human immunodeficiency virus) test. STI (sexually transmitted infection) testing, if you are at risk. Lung cancer screening. Prostate cancer screening. Colorectal cancer screening. Talk with your health care provider about your test results, treatment options, and if necessary, the need for more tests. Follow these instructions at home: Eating and drinking  Eat a diet that includes fresh fruits and vegetables, whole grains, lean protein, and low-fat dairy products. Take vitamin and mineral supplements as recommended by your health care provider. Do not drink alcohol if your health care provider tells you not to drink. If you drink alcohol: Limit how much you have to 0-2 drinks a day. Know how much alcohol is in your drink. In the [place] one drink equals one 12 oz bottle of beer (355 mL), one 5 oz glass of wine (148 mL), or one 1 oz glass of hard liquor (44 mL). Lifestyle Brush your teeth every morning and night with fluoride toothpaste. Floss one time each day. Exercise for at least 30 minutes 5 or more days each week. Do not use any products that contain nicotine or tobacco. These products include cigarettes, chewing tobacco, and vaping devices, such as e-cigarettes. If you need help quitting, ask your  health care provider. Do not use drugs. If you are sexually active, practice safe sex. Use a condom or other form of protection to prevent STIs. Take aspirin only as told by your health care provider. Make sure that you understand how much to take and what form to take. Work with your health care provider to find out whether it is safe and beneficial for you to take aspirin daily. Find healthy ways to manage stress, such as: Meditation, yoga, or listening to music. Journaling. Talking to a trusted person. Spending time with friends and family. Minimize exposure to UV radiation to reduce your risk of skin cancer. Safety Always wear your seat belt while driving or riding in a vehicle. Do not drive: If you have been drinking alcohol. Do not ride with someone who has been drinking. When you are tired or distracted. While texting. If you have been using any mind-altering substances or drugs. Wear a helmet and other protective equipment during sports activities. If you have firearms in your house, make sure you follow all gun safety procedures. What's next? Go to your health care provider once a year for an annual wellness visit. Ask your health care provider how often you should have your eyes and teeth checked. Stay  up to date on all vaccines. This information is not intended to replace advice given to you by your health care provider. Make sure you discuss any questions you have with your health care provider. Document Revised: 02/23/2021 Document Reviewed: 02/23/2021 Elsevier Patient Education  2024 Elsevier Inc.   Steps to Quit Smoking Smoking tobacco is the leading cause of preventable death. It can affect almost every organ in the body. Smoking puts you and those around you at risk for developing many serious chronic diseases. Quitting smoking can be very challenging. Do not get discouraged if you are not successful the first time. Some people need to make many attempts to quit before  they achieve long-term success. Do your best to stick to your quit plan, and talk with your health care provider if you have any questions or concerns. How do I get ready to quit? When you decide to quit smoking, create a plan to help you succeed. Before you quit: Pick a date to quit. Set a date within the next 2 weeks to give you time to prepare. Write down the reasons why you are quitting. Keep this list in places where you will see it often. Tell your family, friends, and co-workers that you are quitting. Support from people you are close to can make quitting easier. Talk with your health care provider about your options for quitting smoking. Find out what treatment options are covered by your health insurance. Identify people, places, things, and activities that make you want to smoke (triggers). Avoid them. What first steps can I take to quit smoking? Throw away all cigarettes at home, at work, and in your car. Throw away smoking accessories, such as Set designer. Clean your car. Make sure to empty the ashtray. Clean your home, including curtains and carpets. What strategies can I use to quit smoking? Talk with your health care provider about combining strategies, such as taking medicines while you are also receiving in-person counseling. Using these two strategies together makes you more likely to succeed in quitting than if you used either strategy on its own. If you are pregnant or breastfeeding, talk with your health care provider about finding counseling or other support strategies to quit smoking. Do not take medicine to help you quit smoking unless your health care provider tells you to. Quit right away Quit smoking completely, instead of gradually reducing how much you smoke over a period of time. Stopping smoking right away may be more successful than gradually quitting. Attend in-person counseling to help you build problem-solving skills. You are more likely to succeed in  quitting if you attend counseling sessions regularly. Even short sessions of 10 minutes can be effective. Take medicine You may take medicines to help you quit smoking. Some medicines require a prescription. You can also purchase over-the-counter medicines. Medicines may have nicotine in them to replace the nicotine in cigarettes. Medicines may: Help to stop cravings. Help to relieve withdrawal symptoms. Your health care provider may recommend: Nicotine patches, gum, or lozenges. Nicotine inhalers or sprays. Non-nicotine medicine that you take by mouth. Find resources Find resources and support systems that can help you quit smoking and remain smoke-free after you quit. These resources are most helpful when you use them often. They include: Online chats with a Veterinary surgeon. Telephone quitlines. Printed Materials engineer. Support groups or group counseling. Text messaging programs. Mobile phone apps or applications. Use apps that can help you stick to your quit plan by providing reminders, tips, and encouragement. Examples  of free services include Quit Guide from the [employer] and smokefree.gov  What can I do to make it easier to quit?  Reach out to your family and friends for support and encouragement. Call telephone quitlines, such as 1-800-QUIT-NOW, reach out to support groups, or work with a counselor for support. Ask people who smoke to avoid smoking around you. Avoid places that trigger you to smoke, such as bars, parties, or smoke-break areas at work. Spend time with people who do not smoke. Lessen the stress in your life. Stress can be a smoking trigger for some people. To lessen stress, try: Exercising regularly. Doing deep-breathing exercises. Doing yoga. Meditating. What benefits will I see if I quit smoking? Over time, you should start to see positive results, such as: Improved sense of smell and taste. Decreased coughing and sore throat. Slower heart rate. Lower blood  pressure. Clearer and healthier skin. The ability to breathe more easily. Fewer sick days. Summary Quitting smoking can be very challenging. Do not get discouraged if you are not successful the first time. Some people need to make many attempts to quit before they achieve long-term success. When you decide to quit smoking, create a plan to help you succeed. Quit smoking right away, not slowly over a period of time. Find resources and support systems that can help you quit smoking and remain smoke-free after you quit. This information is not intended to replace advice given to you by your health care provider. Make sure you discuss any questions you have with your health care provider. Document Revised: 08/19/2021 Document Reviewed: 08/19/2021 Elsevier Patient Education  2024 Elsevier Inc.     Signed,   Meredith Staggers, MD [facility], [facility] 09/06/23 1:31 PM

## 2023-09-07 ENCOUNTER — Encounter: Payer: Self-pay | Admitting: Family Medicine

## 2023-09-07 DIAGNOSIS — E785 Hyperlipidemia, unspecified: Secondary | ICD-10-CM

## 2023-09-09 ENCOUNTER — Encounter: Payer: Self-pay | Admitting: Family Medicine

## 2023-09-10 NOTE — Telephone Encounter (Signed)
Patient asking if you will be rechecking everything or only the levels you thought were concerns.   Please advise

## 2023-09-10 NOTE — Telephone Encounter (Signed)
Patient is asking if he should repeat lipid due to having coffee prior to test and not fasting for 12 hours   Please advise

## 2023-09-10 NOTE — Progress Notes (Signed)
Called pt and left message to call the office for appt.

## 2023-09-11 NOTE — Telephone Encounter (Signed)
 If he had creamer or sugarin the coffee that can throw off those numbers.  I am fine with scheduling a lab only visit for 8-hour fasting labs, repeat lipid panel.

## 2023-09-11 NOTE — Telephone Encounter (Signed)
 See other note regarding coffee and repeat lipid panel.   I am fine with [place] adding a CMP along with a lipid panel if he does come back in for fasting labs to see if those numbers look better.

## 2023-09-13 NOTE — Telephone Encounter (Signed)
 Pt is requesting to repeat Glucose test as well okay to order?

## 2023-09-13 NOTE — Telephone Encounter (Signed)
 We did check an A1c 34-month average of blood sugar that was 6.2 at his last visit.  That would not be affected by whether or not he was fasting that day.  Glucose was normal in the office on December 26 with a reading of 90.

## 2023-09-14 ENCOUNTER — Other Ambulatory Visit (INDEPENDENT_AMBULATORY_CARE_PROVIDER_SITE_OTHER): Payer: PRIVATE HEALTH INSURANCE

## 2023-09-14 DIAGNOSIS — E785 Hyperlipidemia, unspecified: Secondary | ICD-10-CM | POA: Diagnosis not present

## 2023-09-14 LAB — LIPID PANEL
Cholesterol: 241 mg/dL — ABNORMAL HIGH (ref 0–200)
HDL: 44.9 mg/dL (ref >39.00)
LDL Cholesterol: 178 mg/dL — ABNORMAL HIGH (ref 0–99)
NonHDL: 196.52
Total CHOL/HDL Ratio: 5
Triglycerides: 92 mg/dL (ref 0.0–149.0)
VLDL: 18.4 mg/dL (ref 0.0–40.0)

## 2023-10-18 ENCOUNTER — Encounter: Payer: Self-pay | Admitting: Family Medicine

## 2023-10-18 ENCOUNTER — Ambulatory Visit: Payer: PRIVATE HEALTH INSURANCE | Admitting: Family Medicine

## 2023-10-18 VITALS — BP 126/72 | HR 81 | Temp 98.8°F | Ht 65.0 in | Wt 193.4 lb

## 2023-10-18 DIAGNOSIS — F17210 Nicotine dependence, cigarettes, uncomplicated: Secondary | ICD-10-CM

## 2023-10-18 DIAGNOSIS — R7303 Prediabetes: Secondary | ICD-10-CM | POA: Diagnosis not present

## 2023-10-18 DIAGNOSIS — E785 Hyperlipidemia, unspecified: Secondary | ICD-10-CM | POA: Diagnosis not present

## 2023-10-18 DIAGNOSIS — R7989 Other specified abnormal findings of blood chemistry: Secondary | ICD-10-CM

## 2023-10-18 NOTE — Patient Instructions (Addendum)
 Keep up the good work with cutting back on tobacco, and sugar beverages. Continue to exercise daily.   We will recheck labs in 2 months - cholesterol, prediabetes, and liver tests. If you quit smoking, the heart disease risk score will go down (less likely need for cholesterol medicine).   I am happy to see you sooner if needed for any new concerns.  Take care!

## 2023-10-18 NOTE — Progress Notes (Signed)
 Subjective:  Patient ID: Kristopher Avila, male    DOB: 08-18-1981  Age: 43 y.o. MRN: [id number]  CC:  Chief Complaint  Patient presents with   Medical Management of Chronic Issues    Here for 6 wk f/u to discuss recent labs.     HPI Kristopher Avila presents for  Follow-up of chronic conditions, physical in December.  Hyperlipidemia: Elevated LDL of 178 on his recent labs.  Borderline ASCVD risk score.  Does have history of prediabetes, smoking.  Lipid panel had been repeated from December 20 6 January third as not fasting initially.  Triglycerides did improve from 162-92.  Total cholesterol 2 62-241.  LDL minimal change from 183-178. The 10-year ASCVD risk score (Arnett DK, et al., 2019) is: 7.8%   Values used to calculate the score:     Age: 5 years     Sex: Male     Is Non-Hispanic African American: No     Diabetic: No     Tobacco smoker: Yes     Systolic Blood Pressure: 126 mmHg     Is BP treated: No     HDL Cholesterol: 44.9 mg/dL     Total Cholesterol: 241 mg/dL  Lab Results  Component Value Date   CHOL 241 (H) 09/14/2023   HDL 44.90 09/14/2023   LDLCALC 178 (H) 09/14/2023   TRIG 92.0 09/14/2023   CHOLHDL 5 09/14/2023   Lab Results  Component Value Date   ALT 63 (H) 09/06/2023   AST 33 09/06/2023   ALKPHOS 57 09/06/2023   BILITOT 1.0 09/06/2023   Nicotine dependence, cigarettes. Smoking cessation discussed at his December visit.  Chantix  prescribed with starter pack and continuing pack.  Handout given with resources. Has cut back on cigarettes. 5 per day, 1/2 of last visit. Not started chantix  yet.   Prediabetes: Elevated A1c last visit but glucose 90 in the office on December 26th.  Exercise - daily.  No soda - stopped.  Lab Results  Component Value Date   HGBA1C 6.2 09/06/2023   Wt Readings from Last 3 Encounters:  10/18/23 193 lb 6 oz (87.7 kg)  09/06/23 193 lb 2 oz (87.6 kg)  07/10/21 190 lb (86.2 kg)  Body mass index is 32.18 kg/m.  Elevated LFT ALT of 63  at last visit.  Normal AST, alk phos, bilirubin.  Previous ALT 39 on labs few years ago.  Borderline sodium of 134 on recent labs as well.  No nausea, vomiting, abdominal pain or scleral icterus/jaundice.  History Patient Active Problem List   Diagnosis Date Noted   Adjustment disorder with depressed mood 09/21/2021   Generalized anxiety disorder 09/21/2021   History reviewed. No pertinent past medical history. History reviewed. No pertinent surgical history. No Known Allergies Prior to Admission medications   Medication Sig Start Date End Date Taking? Authorizing Provider  varenicline  (CHANTIX  CONTINUING MONTH PAK) 1 MG tablet Take 1 tablet (1 mg total) by mouth 2 (two) times daily. 09/06/23   Levora Reyes SAUNDERS, MD  Varenicline  Tartrate, Starter, (CHANTIX  STARTING MONTH PAK) 0.5 MG X 11 & 1 MG X 42 TBPK Take one 0.5 mg tablet by mouth once daily for 3 days, then increase to one 0.5 mg tablet twice daily for 4 days, then increase to one 1 mg tablet twice daily. 09/06/23   Levora Reyes SAUNDERS, MD   Social History   Socioeconomic History   Marital status: Married    Spouse name: Not on file   Number of children:  Not on file   Years of education: Not on file   Highest education level: Bachelor's degree (e.g., BA, AB, BS)  Occupational History   Not on file  Tobacco Use   Smoking status: Every Day    Current packs/day: 0.50    Types: Cigarettes   Smokeless tobacco: Current  Vaping Use   Vaping status: Never Used  Substance and Sexual Activity   Alcohol use: Not Currently   Drug use: Not Currently   Sexual activity: Yes  Other Topics Concern   Not on file  Social History Narrative   Not on file   Social Drivers of Health   Financial Resource Strain: High Risk (09/02/2023)   Overall Financial Resource Strain (CARDIA)    Difficulty of Paying Living Expenses: Very hard  Food Insecurity: Food Insecurity Present (09/02/2023)   Hunger Vital Sign    Worried About Running Out of  Food in the Last Year: Sometimes true    Ran Out of Food in the Last Year: Sometimes true  Transportation Needs: No Transportation Needs (09/02/2023)   PRAPARE - Administrator, Civil Service (Medical): No    Lack of Transportation (Non-Medical): No  Physical Activity: Insufficiently Active (09/02/2023)   Exercise Vital Sign    Days of Exercise per Week: 3 days    Minutes of Exercise per Session: 40 min  Stress: No Stress Concern Present (09/02/2023)   [employer] of Occupational Health - Occupational Stress Questionnaire    Feeling of Stress : Only a little  Social Connections: Unknown (09/02/2023)   Social Connection and Isolation Panel [NHANES]    Frequency of Communication with Friends and Family: More than three times a week    Frequency of Social Gatherings with Friends and Family: Once a week    Attends Religious Services: 1 to 4 times per year    Active Member of [employer] or Organizations: No    Attends Banker Meetings: Not on file    Marital Status: Patient declined  Intimate Partner Violence: Not At Risk (09/21/2021)   Humiliation, Afraid, Rape, and Kick questionnaire    Fear of Current or Ex-Partner: No    Emotionally Abused: No    Physically Abused: No    Sexually Abused: No    Review of Systems Per hpi  Objective:   Vitals:   10/18/23 1345  BP: 126/72  Pulse: 81  Temp: 98.8 F (37.1 C)  TempSrc: Temporal  SpO2: 100%  Weight: 193 lb 6 oz (87.7 kg)  Height: 5' 5 (1.651 m)     Physical Exam Vitals reviewed.  Constitutional:      Appearance: He is well-developed.  HENT:     Head: Normocephalic and atraumatic.  Neck:     Vascular: No carotid bruit or JVD.  Cardiovascular:     Rate and Rhythm: Normal rate and regular rhythm.     Heart sounds: Normal heart sounds. No murmur heard. Pulmonary:     Effort: Pulmonary effort is normal.     Breath sounds: Normal breath sounds. No rales.  Abdominal:     General: There is no  distension.     Tenderness: There is no abdominal tenderness.  Musculoskeletal:     Right lower leg: No edema.     Left lower leg: No edema.  Skin:    General: Skin is warm and dry.  Neurological:     Mental Status: He is alert and oriented to person, place, and time.  Psychiatric:  Mood and Affect: Mood normal.     Assessment & Plan:  Kristopher Avila is a 43 y.o. male . Hyperlipidemia, unspecified hyperlipidemia type  -Borderline ASCVD risk score, but discussed likely significant improvement in that score once he quit smoking.  Will continue diet/exercise approach for now, recheck labs next few months.  Prediabetes  -Diet/exercise approach with repeat testing in the next few months.  In office glucose looked okay last visit.  LFT elevation  -Asymptomatic with single LFT elevation, differential includes hepatic steatosis with obesity.  Goal BMI less than 30.  Diet/exercise approach as above and repeat labs next visit.  Cigarette nicotine dependence without complication Commended on decreased use, has Chantix , plans to start soon.  No orders of the defined types were placed in this encounter.  Patient Instructions  Keep up the good work with cutting back on tobacco, and sugar beverages. Continue to exercise daily.   We will recheck labs in 2 months - cholesterol, prediabetes, and liver tests. If you quit smoking, the heart disease risk score will go down (less likely need for cholesterol medicine).   I am happy to see you sooner if needed for any new concerns.  Take care!     Signed,   Reyes Pines, MD [facility], [facility] 10/18/23 2:11 PM

## 2023-12-17 ENCOUNTER — Encounter: Payer: Self-pay | Admitting: Family Medicine

## 2023-12-17 ENCOUNTER — Ambulatory Visit (INDEPENDENT_AMBULATORY_CARE_PROVIDER_SITE_OTHER): Payer: PRIVATE HEALTH INSURANCE | Admitting: Family Medicine

## 2023-12-17 VITALS — BP 122/74 | HR 71 | Temp 98.0°F | Ht 65.0 in | Wt 193.8 lb

## 2023-12-17 DIAGNOSIS — E785 Hyperlipidemia, unspecified: Secondary | ICD-10-CM

## 2023-12-17 DIAGNOSIS — Z1159 Encounter for screening for other viral diseases: Secondary | ICD-10-CM

## 2023-12-17 DIAGNOSIS — F17210 Nicotine dependence, cigarettes, uncomplicated: Secondary | ICD-10-CM

## 2023-12-17 DIAGNOSIS — R7303 Prediabetes: Secondary | ICD-10-CM | POA: Diagnosis not present

## 2023-12-17 LAB — COMPREHENSIVE METABOLIC PANEL WITH GFR
ALT: 57 U/L — ABNORMAL HIGH (ref 0–53)
AST: 28 U/L (ref 0–37)
Albumin: 4.7 g/dL (ref 3.5–5.2)
Alkaline Phosphatase: 55 U/L (ref 39–117)
BUN: 13 mg/dL (ref 6–23)
CO2: 26 meq/L (ref 19–32)
Calcium: 9.3 mg/dL (ref 8.4–10.5)
Chloride: 99 meq/L (ref 96–112)
Creatinine, Ser: 0.7 mg/dL (ref 0.40–1.50)
GFR: 112.08 mL/min (ref >60.00)
Glucose, Bld: 101 mg/dL — ABNORMAL HIGH (ref 70–99)
Potassium: 3.6 meq/L (ref 3.5–5.1)
Sodium: 134 meq/L — ABNORMAL LOW (ref 135–145)
Total Bilirubin: 1.3 mg/dL — ABNORMAL HIGH (ref 0.2–1.2)
Total Protein: 7.4 g/dL (ref 6.0–8.3)

## 2023-12-17 LAB — HEMOGLOBIN A1C: Hgb A1c MFr Bld: 6 % (ref 4.6–6.5)

## 2023-12-17 LAB — LIPID PANEL
Cholesterol: 280 mg/dL — ABNORMAL HIGH (ref 0–200)
HDL: 48.8 mg/dL (ref >39.00)
LDL Cholesterol: 208 mg/dL — ABNORMAL HIGH (ref 0–99)
NonHDL: 231.1
Total CHOL/HDL Ratio: 6
Triglycerides: 118 mg/dL (ref 0.0–149.0)
VLDL: 23.6 mg/dL (ref 0.0–40.0)

## 2023-12-17 NOTE — Progress Notes (Signed)
 Subjective:  Patient ID: Kristopher Avila, male    DOB: [birth year]  Age: 43 y.o. MRN: [id number]  CC:  Chief Complaint  Patient presents with   Medical Management of Chronic Issues    Pt is well no questions from him     HPI Kristopher Avila presents for   Prediabetes: Have improved his diet at his last visit with less sugar beverages, was also cutting back from tobacco.  Continued to diet/exercise approach planned with repeat labs today. Fasting for Ramadan for 1 month - up until last week. In [place] for 3 weeks.  Avoiding soda. Tea once per day - minimal sugar added.  1 spoon. Mentos - 5 per day.  Exercise daily for past 3 months.  Doing well.  Lab Results  Component Value Date   HGBA1C 6.2 09/06/2023   Wt Readings from Last 3 Encounters:  12/17/23 193 lb 12.8 oz (87.9 kg)  10/18/23 193 lb 6 oz (87.7 kg)  09/06/23 193 lb 2 oz (87.6 kg)   Hyperlipidemia: Borderline ASCVD risk your previously but likely improvement with dietary changes and if he quit smoking.  Plan for repeat labs today. The 10-year ASCVD risk score (Arnett DK, et al., 2019) is: 7.3%   Values used to calculate the score:     Age: 59 years     Sex: Male     Is Non-Hispanic African American: No     Diabetic: No     Tobacco smoker: Yes     Systolic Blood Pressure: 122 mmHg     Is BP treated: No     HDL Cholesterol: 44.9 mg/dL     Total Cholesterol: 241 mg/dL  Lab Results  Component Value Date   CHOL 241 (H) 09/14/2023   HDL 44.90 09/14/2023   LDLCALC 178 (H) 09/14/2023   TRIG 92.0 09/14/2023   CHOLHDL 5 09/14/2023   Lab Results  Component Value Date   ALT 63 (H) 09/06/2023   AST 33 09/06/2023   ALKPHOS 57 09/06/2023   BILITOT 1.0 09/06/2023   Nicotine addiction Improving at last visit.  Down to 5 cigarettes/day at that time, which was half of his previous use.  He does have Chantix available. Now 3-4 per day.  Has not taken chantix. Still working on quitting on own.     History Patient Active Problem  List   Diagnosis Date Noted   Adjustment disorder with depressed mood 09/21/2021   Generalized anxiety disorder 09/21/2021   No past medical history on file. No past surgical history on file. No Known Allergies Prior to Admission medications   Medication Sig Start Date End Date Taking? Authorizing Provider  varenicline (CHANTIX CONTINUING MONTH PAK) 1 MG tablet Take 1 tablet (1 mg total) by mouth 2 (two) times daily. Patient not taking: Reported on 12/17/2023 09/06/23   Shade Flood, MD  Varenicline Tartrate, Starter, (CHANTIX STARTING MONTH PAK) 0.5 MG X 11 & 1 MG X 42 TBPK Take one 0.5 mg tablet by mouth once daily for 3 days, then increase to one 0.5 mg tablet twice daily for 4 days, then increase to one 1 mg tablet twice daily. Patient not taking: Reported on 12/17/2023 09/06/23   Shade Flood, MD   Social History   Socioeconomic History   Marital status: Married    Spouse name: Not on file   Number of children: Not on file   Years of education: Not on file   Highest education level: Bachelor's degree (e.g., BA, AB,  BS)  Occupational History   Not on file  Tobacco Use   Smoking status: Every Day    Current packs/day: 0.50    Types: Cigarettes   Smokeless tobacco: Current  Vaping Use   Vaping status: Never Used  Substance and Sexual Activity   Alcohol use: Not Currently   Drug use: Not Currently   Sexual activity: Yes  Other Topics Concern   Not on file  Social History Narrative   Not on file   Social Drivers of Health   Financial Resource Strain: High Risk (09/02/2023)   Overall Financial Resource Strain (CARDIA)    Difficulty of Paying Living Expenses: Very hard  Food Insecurity: Food Insecurity Present (09/02/2023)   Hunger Vital Sign    Worried About Running Out of Food in the Last Year: Sometimes true    Ran Out of Food in the Last Year: Sometimes true  Transportation Needs: No Transportation Needs (09/02/2023)   PRAPARE - Scientist, research (physical sciences) (Medical): No    Lack of Transportation (Non-Medical): No  Physical Activity: Insufficiently Active (09/02/2023)   Exercise Vital Sign    Days of Exercise per Week: 3 days    Minutes of Exercise per Session: 40 min  Stress: No Stress Concern Present (09/02/2023)   [employer] of Occupational Health - Occupational Stress Questionnaire    Feeling of Stress : Only a little  Social Connections: Unknown (09/02/2023)   Social Connection and Isolation Panel [NHANES]    Frequency of Communication with Friends and Family: More than three times a week    Frequency of Social Gatherings with Friends and Family: Once a week    Attends Religious Services: 1 to 4 times per year    Active Member of [employer] or Organizations: No    Attends Banker Meetings: Not on file    Marital Status: Patient declined  Intimate Partner Violence: Not At Risk (09/21/2021)   Humiliation, Afraid, Rape, and Kick questionnaire    Fear of Current or Ex-Partner: No    Emotionally Abused: No    Physically Abused: No    Sexually Abused: No    Review of Systems  Constitutional:  Negative for fatigue and unexpected weight change.  Eyes:  Negative for visual disturbance.  Respiratory:  Negative for cough, chest tightness and shortness of breath.   Cardiovascular:  Negative for chest pain, palpitations and leg swelling.  Gastrointestinal:  Negative for abdominal pain and blood in stool.  Neurological:  Negative for dizziness, light-headedness and headaches.     Objective:   Vitals:   12/17/23 1000  BP: 122/74  Pulse: 71  Temp: 98 F (36.7 C)  TempSrc: Temporal  SpO2: 99%  Weight: 193 lb 12.8 oz (87.9 kg)  Height: 5\' 5"  (1.651 m)     Physical Exam Vitals reviewed.  Constitutional:      Appearance: He is well-developed.  HENT:     Head: Normocephalic and atraumatic.  Neck:     Vascular: No carotid bruit or JVD.  Cardiovascular:     Rate and Rhythm: Normal rate and regular  rhythm.     Heart sounds: Normal heart sounds. No murmur heard. Pulmonary:     Effort: Pulmonary effort is normal.     Breath sounds: Normal breath sounds. No rales.  Musculoskeletal:     Right lower leg: No edema.     Left lower leg: No edema.  Skin:    General: Skin is warm and dry.  Neurological:     Mental Status: He is alert and oriented to person, place, and time.  Psychiatric:        Mood and Affect: Mood normal.        Assessment & Plan:  Pauline Trainer is a 43 y.o. male . Prediabetes - Plan: Comprehensive metabolic panel with GFR, Hemoglobin A1c  - Minimal weight change, but has been exercising.  Commended on positive health changes.  Check A1c, labs and adjust plan accordingly.  Need for hepatitis C screening test - Plan: Hepatitis C Antibody  Hyperlipidemia, unspecified hyperlipidemia type - Plan: Lipid panel  - Check labs, borderline ASCVD risk score, would not recommend new meds for now, especially if he quit smoking that score will decrease further.  Check labs and adjust plan accordingly.  Cigarette nicotine dependence without complication  - Improving, still smoking.  Discussed option of Chantix but he would like to continue trying to quit on his own.  Advised to let me know if I can help.  Recheck 6 months with labs at that time.  No orders of the defined types were placed in this encounter.  Patient Instructions  Keep up the good work with diet and exercise.  I will recheck some your labs today.  Cholesterol level is not high enough to recommend a medication at this time, and if you can continue to work on quitting smoking I do not think you will need a cholesterol medication.  Let me know if there are any questions, and keep up the good work!  I will let you know if there are any concerns on labs, take care!    Signed,   Meredith Staggers, MD [facility], [facility] 12/17/23 10:23 AM

## 2023-12-17 NOTE — Patient Instructions (Signed)
 Keep up the good work with diet and exercise.  I will recheck some your labs today.  Cholesterol level is not high enough to recommend a medication at this time, and if you can continue to work on quitting smoking I do not think you will need a cholesterol medication.  Let me know if there are any questions, and keep up the good work!  I will let you know if there are any concerns on labs, take care!

## 2023-12-18 LAB — HEPATITIS C ANTIBODY: Hepatitis C Ab: NONREACTIVE

## 2023-12-21 ENCOUNTER — Encounter: Payer: Self-pay | Admitting: Family Medicine

## 2024-01-03 ENCOUNTER — Encounter: Payer: Self-pay | Admitting: Family Medicine

## 2024-01-09 NOTE — Telephone Encounter (Signed)
 Patient is asking about the duration of need for cholesterol medication

## 2024-06-16 ENCOUNTER — Ambulatory Visit: Payer: PRIVATE HEALTH INSURANCE | Admitting: Family Medicine

## 2024-06-18 ENCOUNTER — Other Ambulatory Visit: Payer: Self-pay | Admitting: Medical Genetics

## 2024-06-23 ENCOUNTER — Other Ambulatory Visit
Admission: RE | Admit: 2024-06-23 | Discharge: 2024-06-23 | Disposition: A | Discharge status: Home / Self Care | Payer: Self-pay | Source: Ambulatory Visit | Attending: Medical Genetics | Admitting: Medical Genetics

## 2024-07-04 LAB — GENECONNECT MOLECULAR SCREEN: Genetic Analysis Overall Interpretation: NEGATIVE
